# Patient Record
Sex: Female | Born: 2000 | Race: Black or African American | Hispanic: No | Marital: Single | Smoking: Never smoker
Health system: Southern US, Community
[De-identification: ages and names within clinical notes are randomized; demographics above are authoritative.]

## PROBLEM LIST (undated history)

## (undated) DIAGNOSIS — T7840XA Allergy, unspecified, initial encounter: Secondary | ICD-10-CM

## (undated) DIAGNOSIS — L509 Urticaria, unspecified: Secondary | ICD-10-CM

## (undated) DIAGNOSIS — Z975 Presence of (intrauterine) contraceptive device: Secondary | ICD-10-CM

## (undated) HISTORY — DX: Allergy, unspecified, initial encounter: T78.40XA

## (undated) HISTORY — DX: Urticaria, unspecified: L50.9

## (undated) HISTORY — DX: Presence of (intrauterine) contraceptive device: Z97.5

---
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
## 2018-03-30 DIAGNOSIS — Z23 Encounter for immunization: Secondary | ICD-10-CM | POA: Insufficient documentation

## 2018-03-30 DIAGNOSIS — Z0000 Encounter for general adult medical examination without abnormal findings: Secondary | ICD-10-CM | POA: Insufficient documentation

## 2018-12-07 ENCOUNTER — Other Ambulatory Visit: Payer: Self-pay | Admitting: Registered"

## 2018-12-07 DIAGNOSIS — Z20822 Contact with and (suspected) exposure to covid-19: Secondary | ICD-10-CM

## 2018-12-08 LAB — NOVEL CORONAVIRUS, NAA: SARS-CoV-2, NAA: NOT DETECTED

## 2018-12-14 ENCOUNTER — Other Ambulatory Visit: Payer: Self-pay

## 2018-12-14 DIAGNOSIS — Z20822 Contact with and (suspected) exposure to covid-19: Secondary | ICD-10-CM

## 2018-12-17 LAB — NOVEL CORONAVIRUS, NAA: SARS-CoV-2, NAA: NOT DETECTED

## 2018-12-17 LAB — SPECIMEN STATUS REPORT

## 2019-02-17 ENCOUNTER — Other Ambulatory Visit: Payer: Self-pay

## 2019-02-17 ENCOUNTER — Ambulatory Visit: Payer: BC Managed Care – PPO | Attending: Internal Medicine

## 2019-02-17 DIAGNOSIS — Z20822 Contact with and (suspected) exposure to covid-19: Secondary | ICD-10-CM

## 2019-02-19 LAB — NOVEL CORONAVIRUS, NAA: SARS-CoV-2, NAA: NOT DETECTED

## 2019-05-05 ENCOUNTER — Ambulatory Visit: Payer: BC Managed Care – PPO | Attending: Family

## 2019-05-05 DIAGNOSIS — Z23 Encounter for immunization: Secondary | ICD-10-CM

## 2019-05-05 NOTE — Progress Notes (Signed)
   Covid-19 Vaccination Clinic  Name:  Regina Robinson    MRN: [id number] DOB: 2000-09-10  05/05/2019  Regina Robinson was observed post Covid-19 immunization for 15 minutes without incident. She was provided with Vaccine Information Sheet and instruction to access the V-Safe system.   Regina Robinson was instructed to call 911 with any severe reactions post vaccine: Marland Kitchen Difficulty breathing  . Swelling of face and throat  . A fast heartbeat  . A bad rash all over body  . Dizziness and weakness   Immunizations Administered    Name Date Dose VIS Date Route   Moderna COVID-19 Vaccine 05/05/2019  2:49 PM 0.5 mL 01/18/2019 Intramuscular   Manufacturer: Moderna   Lot: [id number]   NDC: 64353-912-25

## 2019-06-07 ENCOUNTER — Ambulatory Visit: Payer: BC Managed Care – PPO | Attending: Family

## 2019-06-07 DIAGNOSIS — Z23 Encounter for immunization: Secondary | ICD-10-CM

## 2019-06-07 NOTE — Progress Notes (Signed)
   Covid-19 Vaccination Clinic  Name:  Regina Robinson    MRN: [id number] DOB: [birth year]  06/07/2019  [name] was observed post Covid-19 immunization for 15 minutes without incident. She was provided with Vaccine Information Sheet and instruction to access the V-Safe system.   [name] was instructed to call 911 with any severe reactions post vaccine: Marland Kitchen Difficulty breathing  . Swelling of face and throat  . A fast heartbeat  . A bad rash all over body  . Dizziness and weakness   Immunizations Administered    Name Date Dose VIS Date Route   Moderna COVID-19 Vaccine 06/07/2019 12:32 PM 0.5 mL 01/2019 Intramuscular   Manufacturer: Moderna   Lot: [id number]   NDC: 02301-720-91

## 2019-08-08 ENCOUNTER — Encounter: Payer: Self-pay | Admitting: Medical

## 2019-08-08 ENCOUNTER — Other Ambulatory Visit: Payer: Self-pay

## 2019-08-08 ENCOUNTER — Encounter: Payer: 59 | Admitting: Medical

## 2019-08-08 ENCOUNTER — Ambulatory Visit (INDEPENDENT_AMBULATORY_CARE_PROVIDER_SITE_OTHER): Payer: 59 | Admitting: Medical

## 2019-08-08 VITALS — BP 110/80 | HR 95 | Ht 65.0 in | Wt 159.4 lb

## 2019-08-08 DIAGNOSIS — Z23 Encounter for immunization: Secondary | ICD-10-CM | POA: Diagnosis not present

## 2019-08-08 DIAGNOSIS — Z0000 Encounter for general adult medical examination without abnormal findings: Secondary | ICD-10-CM | POA: Insufficient documentation

## 2019-08-08 DIAGNOSIS — Z130 Encounter for screening for diseases of the blood and blood-forming organs and certain disorders involving the immune mechanism: Secondary | ICD-10-CM

## 2019-08-08 DIAGNOSIS — Z13220 Encounter for screening for lipoid disorders: Secondary | ICD-10-CM | POA: Insufficient documentation

## 2019-08-08 DIAGNOSIS — Z569 Unspecified problems related to employment: Secondary | ICD-10-CM | POA: Insufficient documentation

## 2019-08-08 DIAGNOSIS — Z111 Encounter for screening for respiratory tuberculosis: Secondary | ICD-10-CM | POA: Insufficient documentation

## 2019-08-08 DIAGNOSIS — Z139 Encounter for screening, unspecified: Secondary | ICD-10-CM

## 2019-08-08 LAB — POCT URINALYSIS DIP (PROADVANTAGE DEVICE)
Bilirubin, UA: NEGATIVE
Blood, UA: NEGATIVE
Glucose, UA: NEGATIVE mg/dL
Ketones, POC UA: NEGATIVE mg/dL
Leukocytes, UA: NEGATIVE
Nitrite, UA: NEGATIVE
Specific Gravity, Urine: 1.03
Urobilinogen, Ur: NEGATIVE
pH, UA: 6 (ref 5.0–8.0)

## 2019-08-08 NOTE — Progress Notes (Signed)
Subjective: Chief Complaint  Patient presents with  . Annual Exam    Medical team: Sees dentist, Sees gynecology Regina Robinson, Kermit Balo, PA-C here for primary care   Concerns Needs form completed for school.  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      She feels healthy, doing well  Recently had Pap smear through gynecology it was normal  She is on contraception  Reviewed their medical, surgical, family, social, medication, and allergy history and updated chart as appropriate.  Past Medical History:  Diagnosis Date  . Nexplanon in place 06/2016   [facility] OB/Gyn, Dr. Janee Morn    No past surgical history on file.  Social History   Socioeconomic History  . Marital status: Single    Spouse name: Not on file  . Number of children: Not on file  . Years of education: Not on file  . Highest education level: Not on file  Occupational History  . Not on file  Tobacco Use  . Smoking status: Never Smoker  . Smokeless tobacco: Never Used  Vaping Use  . Vaping Use: Never used  Substance and Sexual Activity  . Alcohol use: No  . Drug use: No  . Sexual activity: Not on file  Other Topics Concern  . 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   Exercise - plays basketball, goes to the gym.  07/2019   Social Determinants of Health   Financial Resource Strain:   . Difficulty of Paying Living Expenses:   Food Insecurity:   . Worried About Programme researcher, broadcasting/film/video in the Last Year:   . Barista in the Last Year:   Transportation Needs:   . Freight forwarder (Medical):   Marland Kitchen Lack of Transportation (Non-Medical):   Physical Activity:   . Days of Exercise per Week:   . Minutes of Exercise per Session:   Stress:   . Feeling of Stress :   Social Connections:   . Frequency of Communication with Friends and Family:   . Frequency of Social Gatherings with Friends and Family:   . Attends Religious Services:    . Active Member of Clubs or Organizations:   . Attends Banker Meetings:   Marland Kitchen Marital Status:   Intimate Partner Violence:   . Fear of Current or Ex-Partner:   . Emotionally Abused:   Marland Kitchen Physically Abused:   . Sexually Abused:     Family History  Problem Relation Age of Onset  . Sickle cell trait Mother   . Diabetes Maternal Grandfather   . Heart disease Paternal Grandfather        heart transplant  . Cancer Neg Hx     No current outpatient medications on file.  Allergies  Allergen Reactions  . Sulfa Drugs Cross Reactors Hives    Review of Systems Constitutional: -fever, -chills, -sweats, -unexpected weight change, -decreased appetite, -fatigue Allergy: -sneezing, -itching, -congestion Dermatology: -changing moles, --rash, -lumps ENT: -runny nose, -ear pain, -sore throat, -hoarseness, -sinus pain, -teeth pain, - ringing in ears, -hearing loss, -nosebleeds Cardiology: -chest pain, -palpitations, -swelling, -difficulty breathing when lying flat, -waking up short of breath Respiratory: -cough, -shortness of breath, -difficulty breathing with exercise or exertion, -wheezing, -coughing up blood Gastroenterology: -abdominal pain, -nausea, -vomiting, -diarrhea, -constipation, -blood in stool, -changes in bowel movement, -difficulty swallowing or eating Hematology: -bleeding, -bruising  Musculoskeletal: -joint aches, -muscle aches, -joint swelling, -back pain, -neck  pain, -cramping, -changes in gait Ophthalmology: denies vision changes, eye redness, itching, discharge Urology: -burning with urination, -difficulty urinating, -blood in urine, -urinary frequency, -urgency, -incontinence Neurology: -headache, -weakness, -tingling, -numbness, -memory loss, -falls, -dizziness Psychology: -depressed mood, -agitation, -sleep problems Breast/gyn: -breast tendnerss, -discharge, -lumps, -vaginal discharge,- irregular periods, -heavy periods     Objective:  BP 110/80   Pulse 95    Ht 5\' 5"  (1.651 m)   Wt 159 lb 6.4 oz (72.3 kg)   SpO2 98%   BMI 26.53 kg/m   General appearance: alert, no distress, WD/WN, African American female Skin: Normal neck: supple, no lymphadenopathy, no thyromegaly, no masses, normal ROM, no bruits Chest: non tender, normal shape and expansion Heart: RRR, normal S1, S2, no murmurs Lungs: CTA bilaterally, no wheezes, rhonchi, or rales Abdomen: +bs, soft, non tender, non distended, no masses, no hepatomegaly, no splenomegaly, no bruits Back: non tender, normal ROM, no scoliosis Musculoskeletal: upper extremities non tender, no obvious deformity, normal ROM throughout, lower extremities non tender, no obvious deformity, normal ROM throughout Extremities: no edema, no cyanosis, no clubbing Pulses: 2+ symmetric, upper and lower extremities, normal cap refill Neurological: alert, oriented x 3, CN2-12 intact, strength normal upper extremities and lower extremities, sensation normal throughout, DTRs 2+ throughout, no cerebellar signs, gait normal Psychiatric: normal affect, behavior normal, pleasant  Breast/gyn/rectal - deferred to gynecology     Assessment and Plan :   Encounter Diagnoses  Name Primary?  . Encounter for health maintenance examination in adult Yes  . Screening for tuberculosis   . Screening for lipid disorders   . Screening for deficiency anemia   . Need for Tdap vaccination   . Need for meningitis vaccination   . Screening for condition   . Adverse exposure in workplace     Physical exam - discussed and counseled on healthy lifestyle, diet, exercise, preventative care, vaccinations, sick and well care, proper use of emergency dept and after hours care, and addressed their concerns.    Health screening: Advised they see their eye doctor yearly for routine vision care. Advised they see their dentist yearly for routine dental care including hygiene visits twice yearly. See your gynecologist yearly for routine  gynecological care.   Cancer screening Counseled on self breast exams, mammograms, cervical cancer screening   Vaccinations: Advised yearly influenza vaccine  Counseled on the Tdap (tetanus, diptheria, and acellular pertussis) vaccine.  Vaccine information sheet given. Tdap vaccine given after consent obtained.  Counseled on the meningococcal vaccine.  Vaccine information sheet given.  Meningococcal vaccine given after consent obtained.  Lab screening today for tuberculosis and titers for hepatitis B and varicella since she will be working as a [employer] [place] was seen today for annual exam.  Diagnoses and all orders for this visit:  Encounter for health maintenance examination in adult -     Varicella zoster antibody, IgG -     Hepatitis B surface antibody,quantitative -     QuantiFERON-TB Gold Plus -     CBC -     Lipid panel -     Comprehensive metabolic panel -     POCT Urinalysis DIP (Proadvantage Device)  Screening for tuberculosis -     QuantiFERON-TB Gold Plus  Screening for lipid disorders -     Lipid panel  Screening for deficiency anemia -     CBC  Need for Tdap vaccination  Need for meningitis vaccination  Screening for condition -     Varicella zoster antibody,  IgG -     Hepatitis B surface antibody,quantitative -     QuantiFERON-TB Gold Plus  Adverse exposure in workplace  Other orders -     Meningococcal MCV4O(Menveo) -     Tdap vaccine greater than or equal to 7yo IM   Follow-up pending labs, yearly for physical

## 2019-08-10 LAB — CBC
Hematocrit: 38.7 % (ref 34.0–46.6)
Hemoglobin: 12.9 g/dL (ref 11.1–15.9)
MCH: 27.4 pg (ref 26.6–33.0)
MCHC: 33.3 g/dL (ref 31.5–35.7)
MCV: 82 fL (ref 79–97)
Platelets: 314 10*3/uL (ref 150–450)
RBC: 4.7 x10E6/uL (ref 3.77–5.28)
RDW: 13 % (ref 11.7–15.4)
WBC: 4.1 10*3/uL (ref 3.4–10.8)

## 2019-08-10 LAB — COMPREHENSIVE METABOLIC PANEL
ALT: 11 IU/L (ref 0–32)
AST: 15 IU/L (ref 0–40)
Albumin/Globulin Ratio: 1.6 (ref 1.2–2.2)
Albumin: 4.4 g/dL (ref 3.9–5.0)
Alkaline Phosphatase: 60 IU/L (ref 48–121)
BUN/Creatinine Ratio: 9 (ref 9–23)
BUN: 10 mg/dL (ref 6–20)
Bilirubin Total: 0.6 mg/dL (ref 0.0–1.2)
CO2: 23 mmol/L (ref 20–29)
Calcium: 9.4 mg/dL (ref 8.7–10.2)
Chloride: 105 mmol/L (ref 96–106)
Creatinine, Ser: 1.14 mg/dL — ABNORMAL HIGH (ref 0.57–1.00)
GFR calc Af Amer: 79 mL/min/{1.73_m2} (ref >59)
GFR calc non Af Amer: 69 mL/min/{1.73_m2} (ref >59)
Globulin, Total: 2.8 g/dL (ref 1.5–4.5)
Glucose: 90 mg/dL (ref 65–99)
Potassium: 4.7 mmol/L (ref 3.5–5.2)
Sodium: 139 mmol/L (ref 134–144)
Total Protein: 7.2 g/dL (ref 6.0–8.5)

## 2019-08-10 LAB — QUANTIFERON-TB GOLD PLUS
QuantiFERON Mitogen Value: >10 IU/mL
QuantiFERON Nil Value: 0 IU/mL
QuantiFERON TB1 Ag Value: 0.03 IU/mL
QuantiFERON TB2 Ag Value: 0.01 IU/mL
QuantiFERON-TB Gold Plus: NEGATIVE

## 2019-08-10 LAB — LIPID PANEL
Chol/HDL Ratio: 3 ratio (ref 0.0–4.4)
Cholesterol, Total: 173 mg/dL (ref 100–199)
HDL: 57 mg/dL (ref >39)
LDL Chol Calc (NIH): 103 mg/dL — ABNORMAL HIGH (ref 0–99)
Triglycerides: 65 mg/dL (ref 0–149)
VLDL Cholesterol Cal: 13 mg/dL (ref 5–40)

## 2019-08-10 LAB — HEPATITIS B SURFACE ANTIBODY, QUANTITATIVE: Hepatitis B Surf Ab Quant: 99 m[IU]/mL (ref >9.9)

## 2019-08-10 LAB — VARICELLA ZOSTER ANTIBODY, IGG: Varicella zoster IgG: 355 index (ref >165)

## 2020-02-13 ENCOUNTER — Encounter: Payer: Self-pay | Admitting: Family Medicine

## 2020-02-13 ENCOUNTER — Telehealth (INDEPENDENT_AMBULATORY_CARE_PROVIDER_SITE_OTHER): Payer: 59 | Admitting: Family Medicine

## 2020-02-13 VITALS — Ht 65.0 in | Wt 150.0 lb

## 2020-02-13 DIAGNOSIS — U071 COVID-19: Secondary | ICD-10-CM | POA: Diagnosis not present

## 2020-02-13 NOTE — Progress Notes (Signed)
   Subjective:    Patient ID: Regina Robinson, female    DOB: 2000/10/03, 19 y.o.   MRN: [id number]  HPI I connected with  Regina Robinson on 02/13/20 by a video enabled telemedicine application and verified that I am speaking with the correct person using two identifiers.  Curability used.  Me: Office patient: Home I discussed the limitations of evaluation and management by telemedicine. The patient expressed understanding and agreed to proceed. She started having a sore throat on the 21st followed by headache, dry cough that has become slightly productive.  She has been using OTC medications.  She has had no fever, chills, shortness of breath, smell or taste changes.  She did have 2 [place] vaccines.  She was tested last night and it was positive.   Review of Systems     Objective:   Physical Exam Alert and in no distress.  Normal breathing pattern noted.       Assessment & Plan:  COVID-19 I discussed the diagnosis and treatment which at this point is supportive care.  Discussed worsening of cough, shortness of breath, fever.  Instructed her to inform me by that she has been there to potentially get tested or at least sequester.  Recommend that they discuss this further with her PCP. 20 minutes spent today reviewing medical record, history, documentation and consultation.

## 2020-04-06 ENCOUNTER — Ambulatory Visit: Payer: BC Managed Care – PPO | Attending: Family

## 2020-04-06 DIAGNOSIS — Z23 Encounter for immunization: Secondary | ICD-10-CM

## 2020-07-19 LAB — HM PAP SMEAR

## 2020-07-23 ENCOUNTER — Encounter: Payer: Self-pay | Admitting: Medical

## 2020-07-27 NOTE — Progress Notes (Signed)
   Covid-19 Vaccination Clinic  Name:  Regina Robinson    MRN: [id number] DOB: [birth year]  07/27/2020  [name] was observed post Covid-19 immunization for 15 minutes without incident. She was provided with Vaccine Information Sheet and instruction to access the V-Safe system.   [name] was instructed to call 911 with any severe reactions post vaccine: Difficulty breathing  Swelling of face and throat  A fast heartbeat  A bad rash all over body  Dizziness and weakness   Immunizations Administered     Name Date Dose VIS Date Route   Moderna Covid-19 Booster Vaccine 04/06/2020  1:00 PM 0.25 mL 12/07/2019 Intramuscular   Manufacturer: Moderna   Lot: [id number]   NDC: 18867-737-36

## 2020-08-08 ENCOUNTER — Other Ambulatory Visit: Payer: Self-pay

## 2020-08-08 ENCOUNTER — Encounter: Payer: Self-pay | Admitting: Medical

## 2020-08-08 ENCOUNTER — Ambulatory Visit (INDEPENDENT_AMBULATORY_CARE_PROVIDER_SITE_OTHER): Payer: BC Managed Care – PPO | Admitting: Medical

## 2020-08-08 VITALS — BP 110/78 | HR 84 | Ht 65.0 in | Wt 173.4 lb

## 2020-08-08 DIAGNOSIS — Z13220 Encounter for screening for lipoid disorders: Secondary | ICD-10-CM

## 2020-08-08 DIAGNOSIS — Z0000 Encounter for general adult medical examination without abnormal findings: Secondary | ICD-10-CM | POA: Diagnosis not present

## 2020-08-08 LAB — POCT URINALYSIS DIP (PROADVANTAGE DEVICE)
Glucose, UA: NEGATIVE mg/dL
Nitrite, UA: NEGATIVE
Protein Ur, POC: 30 mg/dL — AB
Specific Gravity, Urine: 1.03
pH, UA: 6 (ref 5.0–8.0)

## 2020-08-08 NOTE — Patient Instructions (Signed)
Health Maintenance, Female Adopting a healthy lifestyle and getting preventive care are important in promoting health and wellness. Ask your health care provider about: The right schedule for you to have regular tests and exams. Things you can do on your own to prevent diseases and keep yourself healthy. What should I know about diet, weight, and exercise? Eat a healthy diet  Eat a diet that includes plenty of vegetables, fruits, low-fat dairy products, and lean protein. Do not eat a lot of foods that are high in solid fats, added sugars, or sodium.  Maintain a healthy weight Body mass index (BMI) is used to identify weight problems. It estimates body fat based on height and weight. Your health care provider can help determineyour BMI and help you achieve or maintain a healthy weight. Get regular exercise Get regular exercise. This is one of the most important things you can do for your health. Most adults should: Exercise for at least 150 minutes each week. The exercise should increase your heart rate and make you sweat (moderate-intensity exercise). Do strengthening exercises at least twice a week. This is in addition to the moderate-intensity exercise. Spend less time sitting. Even light physical activity can be beneficial. Watch cholesterol and blood lipids Have your blood tested for lipids and cholesterol at 22 years of age, then havethis test every 5 years. Have your cholesterol levels checked more often if: Your lipid or cholesterol levels are high. You are older than 22 years of age. You are at high risk for heart disease. What should I know about cancer screening? Depending on your health history and family history, you may need to have cancer screening at various ages. This may include screening for: Breast cancer. Cervical cancer. Colorectal cancer. Skin cancer. Lung cancer. What should I know about heart disease, diabetes, and high blood pressure? Blood pressure and heart  disease High blood pressure causes heart disease and increases the risk of stroke. This is more likely to develop in people who have high blood pressure readings, are of African descent, or are overweight. Have your blood pressure checked: Every 3-5 years if you are 18-39 years of age. Every year if you are 40 years old or older. Diabetes Have regular diabetes screenings. This checks your fasting blood sugar level. Have the screening done: Once every three years after age 40 if you are at a normal weight and have a low risk for diabetes. More often and at a younger age if you are overweight or have a high risk for diabetes. What should I know about preventing infection? Hepatitis B If you have a higher risk for hepatitis B, you should be screened for this virus. Talk with your health care provider to find out if you are at risk forhepatitis B infection. Hepatitis C Testing is recommended for: Everyone born from 1945 through 1965. Anyone with known risk factors for hepatitis C. Sexually transmitted infections (STIs) Get screened for STIs, including gonorrhea and chlamydia, if: You are sexually active and are younger than 22 years of age. You are older than 22 years of age and your health care provider tells you that you are at risk for this type of infection. Your sexual activity has changed since you were last screened, and you are at increased risk for chlamydia or gonorrhea. Ask your health care provider if you are at risk. Ask your health care provider about whether you are at high risk for HIV. Your health care provider may recommend a prescription medicine to help   prevent HIV infection. If you choose to take medicine to prevent HIV, you should first get tested for HIV. You should then be tested every 3 months for as long as you are taking the medicine. Pregnancy If you are about to stop having your period (premenopausal) and you may become pregnant, seek counseling before you get  pregnant. Take 400 to 800 micrograms (mcg) of folic acid every day if you become pregnant. Ask for birth control (contraception) if you want to prevent pregnancy. Osteoporosis and menopause Osteoporosis is a disease in which the bones lose minerals and strength with aging. This can result in bone fractures. If you are 65 years old or older, or if you are at risk for osteoporosis and fractures, ask your health care provider if you should: Be screened for bone loss. Take a calcium or vitamin D supplement to lower your risk of fractures. Be given hormone replacement therapy (HRT) to treat symptoms of menopause. Follow these instructions at home: Lifestyle Do not use any products that contain nicotine or tobacco, such as cigarettes, e-cigarettes, and chewing tobacco. If you need help quitting, ask your health care provider. Do not use street drugs. Do not share needles. Ask your health care provider for help if you need support or information about quitting drugs. Alcohol use Do not drink alcohol if: Your health care provider tells you not to drink. You are pregnant, may be pregnant, or are planning to become pregnant. If you drink alcohol: Limit how much you use to 0-1 drink a day. Limit intake if you are breastfeeding. Be aware of how much alcohol is in your drink. In the [place] one drink equals one 12 oz bottle of beer (355 mL), one 5 oz glass of wine (148 mL), or one 1 oz glass of hard liquor (44 mL). General instructions Schedule regular health, dental, and eye exams. Stay current with your vaccines. Tell your health care provider if: You often feel depressed. You have ever been abused or do not feel safe at home. Summary Adopting a healthy lifestyle and getting preventive care are important in promoting health and wellness. Follow your health care provider's instructions about healthy diet, exercising, and getting tested or screened for diseases. Follow your health care provider's  instructions on monitoring your cholesterol and blood pressure. This information is not intended to replace advice given to you by your health care provider. Make sure you discuss any questions you have with your healthcare provider. Document Revised: 01/27/2018 Document Reviewed: 01/27/2018 Elsevier Patient Education  2022 Elsevier Inc.  

## 2020-08-08 NOTE — Progress Notes (Signed)
Subjective: Chief Complaint  Patient presents with   Annual Exam    Physical fasting labs     Medical team: Sees dentist, gynecology, Dr. Allyn Kenner Charnell Peplinski, Camelia Eng, PA-C here for primary care   Concerns Doing well  She is on contraception  Reviewed their medical, surgical, family, social, medication, and allergy history and updated chart as appropriate.  History reviewed. No pertinent past medical history.   History reviewed. No pertinent surgical history.  Social History   Socioeconomic History   Marital status: Single    Spouse name: Not on file   Number of children: Not on file   Years of education: Not on file   Highest education level: Not on file  Occupational History   Not on file  Tobacco Use   Smoking status: Never   Smokeless tobacco: Never  Vaping Use   Vaping Use: Never used  Substance and Sexual Activity   Alcohol use: No   Drug use: No   Sexual activity: Not on file  Other Topics Concern   Not on file  Social History Narrative   Martin Majestic to [place].   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  Exercise - plays basketball, goes to the gym.  07/2020   Social Determinants of Health   Financial Resource Strain: Not on file  Food Insecurity: Not on file  Transportation Needs: Not on file  Physical Activity: Not on file  Stress: Not on file  Social Connections: Not on file  Intimate Partner Violence: Not on file    Family History  Problem Relation Age of Onset   Sickle cell trait Mother    Diabetes Maternal Grandfather    Heart disease Paternal Grandfather        heart transplant   Cancer Neg Hx      Current Outpatient Medications:    LARIN FE 1/20 1-20 MG-MCG tablet, Take 1 tablet by mouth daily., Disp: , Rfl:   Allergies  Allergen Reactions   Sulfa Drugs Cross Reactors Hives    Review of Systems Constitutional: -fever, -chills, -sweats,  -unexpected weight change, -decreased appetite, -fatigue Allergy: -sneezing, -itching, -congestion Dermatology: -changing moles, --rash, -lumps ENT: -runny nose, -ear pain, -sore throat, -hoarseness, -sinus pain, -teeth pain, - ringing in ears, -hearing loss, -nosebleeds Cardiology: -chest pain, -palpitations, -swelling, -difficulty breathing when lying flat, -waking up short of breath Respiratory: -cough, -shortness of breath, -difficulty breathing with exercise or exertion, -wheezing, -coughing up blood Gastroenterology: -abdominal pain, -nausea, -vomiting, -diarrhea, -constipation, -blood in stool, -changes in bowel movement, -difficulty swallowing or eating Hematology: -bleeding, -bruising  Musculoskeletal: -joint aches, -muscle aches, -joint swelling, -back pain, -neck pain, -cramping, -changes in gait Ophthalmology: denies vision changes, eye redness, itching, discharge Urology: -burning with urination, -difficulty urinating, -blood in urine, -urinary frequency, -urgency, -incontinence Neurology: -headache, -weakness, -tingling, -numbness, -memory loss, -falls, -dizziness Psychology: -depressed mood, -agitation, -sleep problems Breast/gyn: -breast tendnerss, -discharge, -lumps, -vaginal discharge,- irregular periods, -heavy periods     Objective:  BP 110/78   Pulse 84   Ht $R'5\' 5"'ou$  (1.651 m)   Wt 173 lb 6.4 oz (78.7 kg)   BMI 28.86 kg/m   Wt Readings from Last 3 Encounters:  08/08/20 173 lb 6.4 oz (78.7 kg)  02/13/20 150 lb (68 kg)  08/08/19 159 lb 6.4 oz (72.3 kg)    General appearance: alert, no distress, WD/WN, African American female Skin: Normal neck: supple, no lymphadenopathy, no  thyromegaly, no masses, normal ROM, no bruits Chest: non tender, normal shape and expansion Heart: RRR, normal S1, S2, no murmurs Lungs: CTA bilaterally, no wheezes, rhonchi, or rales Abdomen: +bs, soft, non tender, non distended, no masses, no hepatomegaly, no splenomegaly, no bruits Back: non  tender, normal ROM, no scoliosis Musculoskeletal: upper extremities non tender, no obvious deformity, normal ROM throughout, lower extremities non tender, no obvious deformity, normal ROM throughout Extremities: no edema, no cyanosis, no clubbing Pulses: 2+ symmetric, upper and lower extremities, normal cap refill Neurological: alert, oriented x 3, CN2-12 intact, strength normal upper extremities and lower extremities, sensation normal throughout, DTRs 2+ throughout, no cerebellar signs, gait normal Psychiatric: normal affect, behavior normal, pleasant  Breast/gyn/rectal - deferred to gynecology     Assessment and Plan :   Encounter Diagnoses  Name Primary?   Encounter for health maintenance examination in adult Yes   Screening for lipid disorders      Physical exam - discussed and counseled on healthy lifestyle, diet, exercise, preventative care, vaccinations, sick and well care, proper use of emergency dept and after hours care, and addressed their concerns.    Health screening: Advised they see their eye doctor yearly for routine vision care. Advised they see their dentist yearly for routine dental care including hygiene visits twice yearly. See your gynecologist yearly for routine gynecological care.   Cancer screening Counseled on self breast exams, mammograms, cervical cancer screening   Vaccinations: Immunization History  Administered Date(s) Administered   DTaP 05/17/1998, 07/17/1998, 09/20/1998, 08/05/1999, 03/27/2003   Hepatitis B 05/17/1998, 07/17/1998, 02/07/1999   HiB (PRP-OMP) 05/17/1998, 07/17/1998, 09/20/1998, 08/05/1999   Hpv-Unspecified 05/22/2016, 07/21/2016, 11/24/2016   IPV 05/17/1998, 07/17/1998, 02/07/1999, 03/27/2003   Influenza,inj,Quad PF,6+ Mos 05/12/2016   MMR 05/03/1999, 03/27/2003   Meningococcal B, OMV 09/18/2016, 09/25/2017   Meningococcal Mcv4o 09/18/2016, 08/08/2019   Moderna SARS-COV2 Booster Vaccination 04/06/2020   Moderna Sars-Covid-2  Vaccination 05/05/2019, 06/07/2019   Pneumococcal Conjugate-13 07/17/1998, 09/20/1998, 02/07/1999, 05/03/1999   Tdap 09/19/2009, 08/08/2019   Varicella 05/03/1999, 09/18/2016    Advised yearly influenza vaccine  Reviewed vaccine records   Arriana was seen today for annual exam.  Diagnoses and all orders for this visit:  Encounter for health maintenance examination in adult -     POCT Urinalysis DIP (Proadvantage Device) -     Lipid panel -     CBC -     Basic metabolic panel  Screening for lipid disorders -     Lipid panel   Follow-up pending labs, yearly for physical

## 2020-08-09 LAB — CBC
Hematocrit: 37 % (ref 34.0–46.6)
Hemoglobin: 12.2 g/dL (ref 11.1–15.9)
MCH: 27.8 pg (ref 26.6–33.0)
MCHC: 33 g/dL (ref 31.5–35.7)
MCV: 84 fL (ref 79–97)
Platelets: 304 10*3/uL (ref 150–450)
RBC: 4.39 x10E6/uL (ref 3.77–5.28)
RDW: 14.1 % (ref 11.7–15.4)
WBC: 6.4 10*3/uL (ref 3.4–10.8)

## 2020-08-09 LAB — BASIC METABOLIC PANEL
BUN/Creatinine Ratio: 8 — ABNORMAL LOW (ref 9–23)
BUN: 8 mg/dL (ref 6–20)
CO2: 22 mmol/L (ref 20–29)
Calcium: 9.1 mg/dL (ref 8.7–10.2)
Chloride: 102 mmol/L (ref 96–106)
Creatinine, Ser: 0.97 mg/dL (ref 0.57–1.00)
Glucose: 74 mg/dL (ref 65–99)
Potassium: 4.5 mmol/L (ref 3.5–5.2)
Sodium: 142 mmol/L (ref 134–144)
eGFR: 85 mL/min/{1.73_m2} (ref >59)

## 2020-08-09 LAB — LIPID PANEL
Chol/HDL Ratio: 2.8 ratio (ref 0.0–4.4)
Cholesterol, Total: 156 mg/dL (ref 100–199)
HDL: 55 mg/dL (ref >39)
LDL Chol Calc (NIH): 86 mg/dL (ref 0–99)
Triglycerides: 76 mg/dL (ref 0–149)
VLDL Cholesterol Cal: 15 mg/dL (ref 5–40)

## 2020-08-23 ENCOUNTER — Other Ambulatory Visit (INDEPENDENT_AMBULATORY_CARE_PROVIDER_SITE_OTHER): Payer: BC Managed Care – PPO

## 2020-08-23 ENCOUNTER — Other Ambulatory Visit: Payer: Self-pay

## 2020-08-23 ENCOUNTER — Other Ambulatory Visit: Payer: Self-pay | Admitting: Medical

## 2020-08-23 ENCOUNTER — Telehealth: Payer: Self-pay | Admitting: Internal Medicine

## 2020-08-23 DIAGNOSIS — R3129 Other microscopic hematuria: Secondary | ICD-10-CM | POA: Diagnosis not present

## 2020-08-23 LAB — POCT URINALYSIS DIP (PROADVANTAGE DEVICE)
Bilirubin, UA: NEGATIVE
Blood, UA: NEGATIVE
Glucose, UA: NEGATIVE mg/dL
Ketones, POC UA: NEGATIVE mg/dL
Leukocytes, UA: NEGATIVE
Nitrite, UA: NEGATIVE
Protein Ur, POC: 30 mg/dL — AB
Specific Gravity, Urine: 1.025
Urobilinogen, Ur: NEGATIVE
pH, UA: 6 (ref 5.0–8.0)

## 2020-08-23 LAB — POCT UA - MICROSCOPIC ONLY
Bacteria, U Microscopic: POSITIVE
Casts, Ur, LPF, POC: NEGATIVE
Crystals, Ur, HPF, POC: NEGATIVE
Mucus, UA: POSITIVE
Yeast, UA: NEGATIVE

## 2020-08-23 NOTE — Telephone Encounter (Signed)
Done

## 2020-08-23 NOTE — Telephone Encounter (Signed)
Please put in order for urine that pt is coming back in this afternoon for

## 2020-08-24 ENCOUNTER — Other Ambulatory Visit: Payer: Self-pay | Admitting: Medical

## 2020-08-24 DIAGNOSIS — R809 Proteinuria, unspecified: Secondary | ICD-10-CM

## 2021-07-18 HISTORY — PX: NO PAST SURGERIES: SHX2092

## 2021-07-22 LAB — OB RESULTS CONSOLE GC/CHLAMYDIA: Chlamydia: NEGATIVE

## 2021-07-23 ENCOUNTER — Encounter: Payer: Self-pay | Admitting: *Deleted

## 2021-08-09 ENCOUNTER — Encounter: Payer: BC Managed Care – PPO | Admitting: Medical

## 2021-08-16 ENCOUNTER — Ambulatory Visit: Payer: BC Managed Care – PPO | Admitting: Medical

## 2021-08-16 ENCOUNTER — Encounter: Payer: Self-pay | Admitting: Medical

## 2021-08-16 VITALS — BP 120/70 | HR 55 | Ht 65.75 in | Wt 190.6 lb

## 2021-08-16 DIAGNOSIS — Z0000 Encounter for general adult medical examination without abnormal findings: Secondary | ICD-10-CM | POA: Diagnosis not present

## 2021-08-16 DIAGNOSIS — Z13220 Encounter for screening for lipoid disorders: Secondary | ICD-10-CM | POA: Diagnosis not present

## 2021-08-16 DIAGNOSIS — Z1329 Encounter for screening for other suspected endocrine disorder: Secondary | ICD-10-CM | POA: Diagnosis not present

## 2021-08-16 NOTE — Progress Notes (Signed)
Subjective: Chief Complaint  Patient presents with   fasting cpe    Fasting cpe, obgyn- [facility]   Medical team: Sees dentist, gynecology, Dr. 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  She is on contraception  Reviewed their medical, surgical, family, social, medication, and allergy history and updated chart as appropriate.  History reviewed. No pertinent past medical history.   Past Surgical History:  Procedure Laterality Date   NO PAST SURGERIES  07/2021    Social History   Socioeconomic History   Marital status: Single    Spouse name: Not on file   Number of children: Not on file   Years of education: Not on file   Highest education level: Not on file  Occupational History   Not on file  Tobacco Use   Smoking status: Never   Smokeless tobacco: Never  Vaping Use   Vaping Use: Never used  Substance and Sexual Activity   Alcohol use: Yes    Comment: occasional   Drug use: No   Sexual activity: Not on file  Other Topics Concern   Not on file  Social History Narrative   Martin Majestic to [place].   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  Exercise - plays basketball, goes to the gym.  07/2020   Social Determinants of Health   Financial Resource Strain: Not on file  Food Insecurity: Not on file  Transportation Needs: Not on file  Physical Activity: Not on file  Stress: Not on file  Social Connections: Not on file  Intimate Partner Violence: Not on file    Family History  Problem Relation Age of Onset   Sickle cell trait Mother    Diabetes Maternal Grandfather    Heart disease Paternal Grandfather        heart transplant   Cancer Neg Hx      Current Outpatient Medications:    LARIN FE 1/20 1-20 MG-MCG tablet, Take 1 tablet by mouth  daily., Disp: , Rfl:   Allergies  Allergen Reactions   Sulfa Drugs Cross Reactors Hives    Review of Systems Constitutional: -fever, -chills, -sweats, -unexpected weight change, -decreased appetite, -fatigue Allergy: -sneezing, -itching, -congestion Dermatology: -changing moles, --rash, -lumps ENT: -runny nose, -ear pain, -sore throat, -hoarseness, -sinus pain, -teeth pain, - ringing in ears, -hearing loss, -nosebleeds Cardiology: -chest pain, -palpitations, -swelling, -difficulty breathing when lying flat, -waking up short of breath Respiratory: -cough, -shortness of breath, -difficulty breathing with exercise or exertion, -wheezing, -coughing up blood Gastroenterology: -abdominal pain, -nausea, -vomiting, -diarrhea, -constipation, -blood in stool, -changes in bowel movement, -difficulty swallowing or eating Hematology: -bleeding, -bruising  Musculoskeletal: -joint aches, -muscle aches, -joint swelling, -back pain, -neck pain, -cramping, -changes in gait Ophthalmology: denies vision changes, eye redness, itching, discharge Urology: -burning with urination, -difficulty urinating, -blood in urine, -urinary frequency, -urgency, -incontinence Neurology: -headache, -weakness, -tingling, -numbness, -memory loss, -falls, -dizziness Psychology: -depressed mood, -agitation, -sleep problems Breast/gyn: -breast tendnerss, -discharge, -lumps, -vaginal discharge,- irregular periods, -heavy periods     Objective:  BP 120/70   Pulse (!) 55   Ht 5' 5.75" (1.67 m)   Wt 190 lb 9.6 oz (86.5 kg)   LMP 08/09/2021   BMI 31.00 kg/m   Wt Readings from Last 3 Encounters:  08/16/21 190 lb  9.6 oz (86.5 kg)  08/08/20 173 lb 6.4 oz (78.7 kg)  02/13/20 150 lb (68 kg)    General appearance: alert, no distress, WD/WN, African American female Skin: Normal neck: supple, no lymphadenopathy, no thyromegaly, no masses, normal ROM, no bruits Chest: non tender, normal shape and expansion Heart: RRR, normal S1,  S2, no murmurs Lungs: CTA bilaterally, no wheezes, rhonchi, or rales Abdomen: +bs, soft, non tender, non distended, no masses, no hepatomegaly, no splenomegaly, no bruits Back: non tender, normal ROM, no scoliosis Musculoskeletal: upper extremities non tender, no obvious deformity, normal ROM throughout, lower extremities non tender, no obvious deformity, normal ROM throughout Extremities: no edema, no cyanosis, no clubbing Pulses: 2+ symmetric, upper and lower extremities, normal cap refill Neurological: alert, oriented x 3, CN2-12 intact, strength normal upper extremities and lower extremities, sensation normal throughout, DTRs 2+ throughout, no cerebellar signs, gait normal Psychiatric: normal affect, behavior normal, pleasant  Breast/gyn/rectal - deferred to gynecology     Assessment and Plan :   Encounter Diagnoses  Name Primary?   Encounter for health maintenance examination in adult Yes   Screening for lipid disorders    Screening for thyroid disorder    This visit was a preventative care visit, also known as wellness visit or routine physical.   Topics typically include healthy lifestyle, diet, exercise, preventative care, vaccinations, sick and well care, proper use of emergency dept and after hours care, as well as other concerns.     Recommendations: Continue to return yearly for your annual wellness and preventative care visits.  This gives [place] a chance to discuss healthy lifestyle, exercise, vaccinations, review your chart record, and perform screenings where appropriate.  I recommend you see your eye doctor yearly for routine vision care.  I recommend you see your dentist yearly for routine dental care including hygiene visits twice yearly.   Vaccination recommendations were reviewed Immunization History  Administered Date(s) Administered   DTaP 05/17/1998, 07/17/1998, 09/20/1998, 08/05/1999, 03/27/2003   Hepatitis B 05/17/1998, 07/17/1998, 02/07/1999   HiB (PRP-OMP)  05/17/1998, 07/17/1998, 09/20/1998, 08/05/1999   Hpv-Unspecified 05/22/2016, 07/21/2016, 11/24/2016   IPV 05/17/1998, 07/17/1998, 02/07/1999, 03/27/2003   Influenza,inj,Quad PF,6+ Mos 05/12/2016, 11/03/2020   MMR 05/03/1999, 03/27/2003   Meningococcal B, OMV 09/18/2016, 09/25/2017   Meningococcal Mcv4o 09/18/2016, 08/08/2019   Moderna SARS-COV2 Booster Vaccination 04/06/2020   Moderna Sars-Covid-2 Vaccination 05/05/2019, 06/07/2019   Pneumococcal Conjugate-13 07/17/1998, 09/20/1998, 02/07/1999, 05/03/1999   Tdap 09/19/2009, 08/08/2019   Varicella 05/03/1999, 09/18/2016    Screening for cancer: Colon cancer screening: Age 40  Breast cancer screening: You should perform a self breast exam monthly   Baseline mammogram at age 19  Cervical cancer screening: We reviewed recommendations for pap smear screening.   Skin cancer screening: Check your skin regularly for new changes, growing lesions, or other lesions of concern Come in for evaluation if you have skin lesions of concern.  Lung cancer screening: If you have a greater than 20 pack year history of tobacco use, then you may qualify for lung cancer screening with a chest CT scan.   Please call your insurance company to inquire about coverage for this test.  We currently don't have screenings for other cancers besides breast, cervical, colon, and lung cancers.  If you have a strong family history of cancer or have other cancer screening concerns, please let me know.    Bone health: Get at least 150 minutes of aerobic exercise weekly Get weight bearing exercise at least once weekly Bone density test:  A bone density test is an imaging test that uses a type of X-ray to measure the amount of calcium and other minerals in your bones. The test may be used to diagnose or screen you for a condition that causes weak or thin bones (osteoporosis), predict your risk for a broken bone (fracture), or determine how well your osteoporosis  treatment is working. The bone density test is recommended for females 54 and older, or females or males <82 if certain risk factors such as thyroid disease, long term use of steroids such as for asthma or rheumatological issues, vitamin D deficiency, estrogen deficiency, family history of osteoporosis, self or family history of fragility fracture in first degree relative.   Heart health: Get at least 150 minutes of aerobic exercise weekly Limit alcohol It is important to maintain a healthy blood pressure and healthy cholesterol numbers  Heart disease screening: Screening for heart disease includes screening for blood pressure, fasting lipids, glucose/diabetes screening, BMI height to weight ratio, reviewed of smoking status, physical activity, and diet.    Goals include blood pressure 120/80 or less, maintaining a healthy lipid/cholesterol profile, preventing diabetes or keeping diabetes numbers under good control, not smoking or using tobacco products, exercising most days per week or at least 150 minutes per week of exercise, and eating healthy variety of fruits and vegetables, healthy oils, and avoiding unhealthy food choices like fried food, fast food, high sugar and high cholesterol foods.     Medical care options: I recommend you continue to seek care here first for routine care.  We try really hard to have available appointments Monday through Friday daytime hours for sick visits, acute visits, and physicals.  Urgent care should be used for after hours and weekends for significant issues that cannot wait till the next day.  The emergency department should be used for significant potentially life-threatening emergencies.  The emergency department is expensive, can often have long wait times for less significant concerns, so try to utilize primary care, urgent care, or telemedicine when possible to avoid unnecessary trips to the emergency department.  Virtual visits and telemedicine have been  introduced since the pandemic started in 2020, and can be convenient ways to receive medical care.  We offer virtual appointments as well to assist you in a variety of options to seek medical care.   Other issues: Work on healthy lifestyle and getting weight back down.  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  Diagnoses and all orders for this visit:  Encounter for health maintenance examination in adult -     Comprehensive metabolic panel -     CBC -     TSH -     Lipid panel  Screening for lipid disorders -     Lipid panel  Screening for thyroid disorder -     TSH    Follow-up pending labs, yearly for physical

## 2021-08-16 NOTE — Patient Instructions (Addendum)
Congratulations on your new job and for graduating!!!  Health Maintenance, Female Adopting a healthy lifestyle and getting preventive care are important in promoting health and wellness. Ask your health care provider about: The right schedule for you to have regular tests and exams. Things you can do on your own to prevent diseases and keep yourself healthy. What should I know about diet, weight, and exercise? Eat a healthy diet  Eat a diet that includes plenty of vegetables, fruits, low-fat dairy products, and lean protein. Do not eat a lot of foods that are high in solid fats, added sugars, or sodium. Maintain a healthy weight Body mass index (BMI) is used to identify weight problems. It estimates body fat based on height and weight. Your health care provider can help determine your BMI and help you achieve or maintain a healthy weight. Get regular exercise Get regular exercise. This is one of the most important things you can do for your health. Most adults should: Exercise for at least 150 minutes each week. The exercise should increase your heart rate and make you sweat (moderate-intensity exercise). Do strengthening exercises at least twice a week. This is in addition to the moderate-intensity exercise. Spend less time sitting. Even light physical activity can be beneficial. Watch cholesterol and blood lipids Have your blood tested for lipids and cholesterol at 23 years of age, then have this test every 5 years. Have your cholesterol levels checked more often if: Your lipid or cholesterol levels are high. You are older than 23 years of age. You are at high risk for heart disease. What should I know about cancer screening? Depending on your health history and family history, you may need to have cancer screening at various ages. This may include screening for: Breast cancer. Cervical cancer. Colorectal cancer. Skin cancer. Lung cancer. What should I know about heart disease,  diabetes, and high blood pressure? Blood pressure and heart disease High blood pressure causes heart disease and increases the risk of stroke. This is more likely to develop in people who have high blood pressure readings or are overweight. Have your blood pressure checked: Every 3-5 years if you are 50-65 years of age. Every year if you are 79 years old or older. Diabetes Have regular diabetes screenings. This checks your fasting blood sugar level. Have the screening done: Once every three years after age 55 if you are at a normal weight and have a low risk for diabetes. More often and at a younger age if you are overweight or have a high risk for diabetes. What should I know about preventing infection? Hepatitis B If you have a higher risk for hepatitis B, you should be screened for this virus. Talk with your health care provider to find out if you are at risk for hepatitis B infection. Hepatitis C Testing is recommended for: Everyone born from [birth year] through 1967. Anyone with known risk factors for hepatitis C. Sexually transmitted infections (STIs) Get screened for STIs, including gonorrhea and chlamydia, if: You are sexually active and are younger than 23 years of age. You are older than 23 years of age and your health care provider tells you that you are at risk for this type of infection. Your sexual activity has changed since you were last screened, and you are at increased risk for chlamydia or gonorrhea. Ask your health care provider if you are at risk. Ask your health care provider about whether you are at high risk for HIV. Your health care provider  may recommend a prescription medicine to help prevent HIV infection. If you choose to take medicine to prevent HIV, you should first get tested for HIV. You should then be tested every 3 months for as long as you are taking the medicine. Pregnancy If you are about to stop having your period (premenopausal) and you may become pregnant,  seek counseling before you get pregnant. Take 400 to 800 micrograms (mcg) of folic acid every day if you become pregnant. Ask for birth control (contraception) if you want to prevent pregnancy. Osteoporosis and menopause Osteoporosis is a disease in which the bones lose minerals and strength with aging. This can result in bone fractures. If you are 47 years old or older, or if you are at risk for osteoporosis and fractures, ask your health care provider if you should: Be screened for bone loss. Take a calcium or vitamin D supplement to lower your risk of fractures. Be given hormone replacement therapy (HRT) to treat symptoms of menopause. Follow these instructions at home: Alcohol use Do not drink alcohol if: Your health care provider tells you not to drink. You are pregnant, may be pregnant, or are planning to become pregnant. If you drink alcohol: Limit how much you have to: 0-1 drink a day. Know how much alcohol is in your drink. In the [place] one drink equals one 12 oz bottle of beer (355 mL), one 5 oz glass of wine (148 mL), or one 1 oz glass of hard liquor (44 mL). Lifestyle Do not use any products that contain nicotine or tobacco. These products include cigarettes, chewing tobacco, and vaping devices, such as e-cigarettes. If you need help quitting, ask your health care provider. Do not use street drugs. Do not share needles. Ask your health care provider for help if you need support or information about quitting drugs. General instructions Schedule regular health, dental, and eye exams. Stay current with your vaccines. Tell your health care provider if: You often feel depressed. You have ever been abused or do not feel safe at home. Summary Adopting a healthy lifestyle and getting preventive care are important in promoting health and wellness. Follow your health care provider's instructions about healthy diet, exercising, and getting tested or screened for diseases. Follow your  health care provider's instructions on monitoring your cholesterol and blood pressure. This information is not intended to replace advice given to you by your health care provider. Make sure you discuss any questions you have with your health care provider. Document Revised: 06/25/2020 Document Reviewed: 06/25/2020 Elsevier Patient Education  2023 [employer].       Check out Salley Slaughter podcast on financial information and financial foundation

## 2021-08-17 LAB — CBC
Hematocrit: 39.4 % (ref 34.0–46.6)
Hemoglobin: 13 g/dL (ref 11.1–15.9)
MCH: 26.9 pg (ref 26.6–33.0)
MCHC: 33 g/dL (ref 31.5–35.7)
MCV: 81 fL (ref 79–97)
Platelets: 356 10*3/uL (ref 150–450)
RBC: 4.84 x10E6/uL (ref 3.77–5.28)
RDW: 13.8 % (ref 11.7–15.4)
WBC: 7.5 10*3/uL (ref 3.4–10.8)

## 2021-08-17 LAB — COMPREHENSIVE METABOLIC PANEL
ALT: 14 IU/L (ref 0–32)
AST: 18 IU/L (ref 0–40)
Albumin/Globulin Ratio: 1.6 (ref 1.2–2.2)
Albumin: 4.4 g/dL (ref 3.9–5.0)
Alkaline Phosphatase: 79 IU/L (ref 44–121)
BUN/Creatinine Ratio: 11 (ref 9–23)
BUN: 11 mg/dL (ref 6–20)
Bilirubin Total: 0.4 mg/dL (ref 0.0–1.2)
CO2: 21 mmol/L (ref 20–29)
Calcium: 9.5 mg/dL (ref 8.7–10.2)
Chloride: 101 mmol/L (ref 96–106)
Creatinine, Ser: 1.01 mg/dL — ABNORMAL HIGH (ref 0.57–1.00)
Globulin, Total: 2.7 g/dL (ref 1.5–4.5)
Glucose: 88 mg/dL (ref 70–99)
Potassium: 4.1 mmol/L (ref 3.5–5.2)
Sodium: 137 mmol/L (ref 134–144)
Total Protein: 7.1 g/dL (ref 6.0–8.5)
eGFR: 80 mL/min/{1.73_m2} (ref >59)

## 2021-08-17 LAB — LIPID PANEL
Chol/HDL Ratio: 3.5 ratio (ref 0.0–4.4)
Cholesterol, Total: 184 mg/dL (ref 100–199)
HDL: 52 mg/dL (ref >39)
LDL Chol Calc (NIH): 114 mg/dL — ABNORMAL HIGH (ref 0–99)
Triglycerides: 100 mg/dL (ref 0–149)
VLDL Cholesterol Cal: 18 mg/dL (ref 5–40)

## 2021-08-17 LAB — TSH: TSH: 1.57 u[IU]/mL (ref 0.450–4.500)

## 2021-08-22 ENCOUNTER — Telehealth: Payer: Self-pay | Admitting: Medical

## 2021-08-22 NOTE — Telephone Encounter (Signed)
[facility] requested records received

## 2021-08-22 NOTE — Telephone Encounter (Signed)
[facility] requested records received  

## 2021-08-23 ENCOUNTER — Encounter: Payer: Self-pay | Admitting: Internal Medicine

## 2021-08-29 ENCOUNTER — Encounter: Payer: Self-pay | Admitting: Medical

## 2021-10-23 ENCOUNTER — Encounter: Payer: Self-pay | Admitting: Internal Medicine

## 2021-11-26 ENCOUNTER — Encounter: Payer: Self-pay | Admitting: Internal Medicine

## 2021-12-04 ENCOUNTER — Encounter: Payer: Self-pay | Admitting: Medical

## 2022-02-25 ENCOUNTER — Encounter: Payer: Self-pay | Admitting: Internal Medicine

## 2022-08-01 ENCOUNTER — Encounter: Payer: Self-pay | Admitting: Internal Medicine

## 2022-08-01 LAB — HM PAP SMEAR: HM Pap smear: NORMAL

## 2022-08-13 ENCOUNTER — Telehealth: Payer: Self-pay | Admitting: Medical

## 2022-08-13 NOTE — Telephone Encounter (Signed)
Medical Records received from [facility]

## 2022-08-14 ENCOUNTER — Encounter: Payer: Self-pay | Admitting: Internal Medicine

## 2022-08-22 ENCOUNTER — Encounter: Payer: BC Managed Care – PPO | Admitting: Medical

## 2022-08-25 ENCOUNTER — Encounter: Payer: Self-pay | Admitting: Medical

## 2022-08-25 ENCOUNTER — Ambulatory Visit (INDEPENDENT_AMBULATORY_CARE_PROVIDER_SITE_OTHER): Payer: BC Managed Care – PPO | Admitting: Medical

## 2022-08-25 VITALS — BP 120/70 | HR 80 | Ht 65.0 in | Wt 177.4 lb

## 2022-08-25 DIAGNOSIS — L509 Urticaria, unspecified: Secondary | ICD-10-CM | POA: Diagnosis not present

## 2022-08-25 DIAGNOSIS — Z0000 Encounter for general adult medical examination without abnormal findings: Secondary | ICD-10-CM

## 2022-08-25 DIAGNOSIS — Z13220 Encounter for screening for lipoid disorders: Secondary | ICD-10-CM | POA: Diagnosis not present

## 2022-08-25 DIAGNOSIS — Z789 Other specified health status: Secondary | ICD-10-CM

## 2022-08-25 DIAGNOSIS — Z1329 Encounter for screening for other suspected endocrine disorder: Secondary | ICD-10-CM

## 2022-08-25 LAB — COMPREHENSIVE METABOLIC PANEL
AST: 61 IU/L — ABNORMAL HIGH (ref 0–40)
Alkaline Phosphatase: 83 IU/L (ref 44–121)
BUN: 9 mg/dL (ref 6–20)
Bilirubin Total: 0.4 mg/dL (ref 0.0–1.2)
Calcium: 9.2 mg/dL (ref 8.7–10.2)
Chloride: 104 mmol/L (ref 96–106)
Globulin, Total: 3.2 g/dL (ref 1.5–4.5)
Potassium: 4.7 mmol/L (ref 3.5–5.2)

## 2022-08-25 LAB — LIPID PANEL
Cholesterol, Total: 175 mg/dL (ref 100–199)
HDL: 43 mg/dL (ref >39)
VLDL Cholesterol Cal: 21 mg/dL (ref 5–40)

## 2022-08-25 LAB — CBC
Hematocrit: 39.8 % (ref 34.0–46.6)
MCHC: 32.2 g/dL (ref 31.5–35.7)

## 2022-08-25 LAB — TSH

## 2022-08-25 NOTE — Progress Notes (Signed)
Subjective: Chief Complaint  Patient presents with   Annual Exam    Nonfasting cpe, breaking out in hives for the last year- would like a referral to allergist.    Medical team: Sees dentist, gynecology, Dr. Philip Aspen, [facility] Daun Rens, Kermit Balo, PA-C here for primary care   Concerns In the past year has had recurrent hives.  Thinks its related to something she eats.   Will gets hives /whelps on face around eyes.  Thinks it could be related to tomato or other.   Working as a Engineer, civil (consulting) at [employer].  She is on contraception  Goes to exercise classes 2-3 days per week  Reviewed their medical, surgical, family, social, medication, and allergy history and updated chart as appropriate.  Past Medical History:  Diagnosis Date   Allergy 03/2021   Think i may be allergic to tomatos     Past Surgical History:  Procedure Laterality Date   NO PAST SURGERIES  07/2021    Social History   Socioeconomic History   Marital status: Single    Spouse name: Not on file   Number of children: Not on file   Years of education: Not on file   Highest education level: Not on file  Occupational History   Not on file  Tobacco Use   Smoking status: Never   Smokeless tobacco: Never  Vaping Use   Vaping Use: Never used  Substance and Sexual Activity   Alcohol use: Yes    Comment: occasional   Drug use: No   Sexual activity: Yes    Birth control/protection: Condom, Pill  Other Topics Concern   Not on file  Social History Narrative   Went to [place].   Graduated [employer] nursing program 2023.  Working at [employer]  Exercise - plays basketball, goes to the gym.  08/2022   Social Determinants of Health   Financial Resource Strain: Low Risk  (08/25/2022)   Overall Financial Resource Strain (CARDIA)    Difficulty of Paying Living Expenses: Not hard at all  Food Insecurity: No Food Insecurity (08/25/2022)   Hunger Vital Sign    Worried  About Running Out of Food in the Last Year: Never true    Ran Out of Food in the Last Year: Never true  Transportation Needs: No Transportation Needs (08/25/2022)   PRAPARE - Administrator, Civil Service (Medical): No    Lack of Transportation (Non-Medical): No  Physical Activity: Insufficiently Active (08/25/2022)   Exercise Vital Sign    Days of Exercise per Week: 3 days    Minutes of Exercise per Session: 40 min  Stress: No Stress Concern Present (08/25/2022)   [employer] of Occupational Health - Occupational Stress Questionnaire    Feeling of Stress : Not at all  Social Connections: Moderately Integrated (08/25/2022)   Social Connection and Isolation Panel [NHANES]    Frequency of Communication with Friends and Family: More than three times a week    Frequency of Social Gatherings with Friends and Family: Once a week    Attends Religious Services: More than 4 times per year    Active Member of [employer] or Organizations: Yes    Attends Banker Meetings: 1 to 4 times per year    Marital Status: Never married  Intimate Partner Violence: Not At Risk (08/25/2022)   Humiliation, Afraid, Rape, and Kick questionnaire    Fear of Current or Ex-Partner: No  Emotionally Abused: No    Physically Abused: No    Sexually Abused: No    Family History  Problem Relation Age of Onset   Sickle cell trait Mother    Diabetes Maternal Grandfather    Heart disease Paternal Grandfather        heart transplant   Cancer Neg Hx      Current Outpatient Medications:    LARIN FE 1/20 1-20 MG-MCG tablet, Take 1 tablet by mouth daily., Disp: , Rfl:   Allergies  Allergen Reactions   Sulfa Drugs Cross Reactors Hives   Review of Systems  Constitutional:  Negative for chills, fever, malaise/fatigue and weight loss.  HENT:  Negative for congestion, ear pain, hearing loss, sore throat and tinnitus.   Eyes:  Negative for blurred vision, pain and redness.  Respiratory:  Negative for  cough, hemoptysis and shortness of breath.   Cardiovascular:  Negative for chest pain, palpitations, orthopnea, claudication and leg swelling.  Gastrointestinal:  Negative for abdominal pain, blood in stool, constipation, diarrhea, nausea and vomiting.  Genitourinary:  Negative for dysuria, flank pain, frequency, hematuria and urgency.  Musculoskeletal:  Negative for falls, joint pain and myalgias.  Skin:  Positive for rash. Negative for itching.       hives  Neurological:  Negative for dizziness, tingling, speech change, weakness and headaches.  Endo/Heme/Allergies:  Negative for polydipsia. Does not bruise/bleed easily.  Psychiatric/Behavioral:  Negative for depression and memory loss. The patient is not nervous/anxious and does not have insomnia.        Objective:  BP 120/70   Pulse 80   Ht 5\' 5"  (1.651 m)   Wt 177 lb 6.4 oz (80.5 kg)   BMI 29.52 kg/m   Wt Readings from Last 3 Encounters:  08/25/22 177 lb 6.4 oz (80.5 kg)  08/16/21 190 lb 9.6 oz (86.5 kg)  08/08/20 173 lb 6.4 oz (78.7 kg)    General appearance: alert, no distress, WD/WN, African American female Skin: Normal neck: supple, no lymphadenopathy, no thyromegaly, no masses, normal ROM, no bruits Chest: non tender, normal shape and expansion Heart: RRR, normal S1, S2, no murmurs Lungs: CTA bilaterally, no wheezes, rhonchi, or rales Abdomen: +bs, soft, non tender, non distended, no masses, no hepatomegaly, no splenomegaly, no bruits Back: non tender, normal ROM, no scoliosis Musculoskeletal: upper extremities non tender, no obvious deformity, normal ROM throughout, lower extremities non tender, no obvious deformity, normal ROM throughout Extremities: no edema, no cyanosis, no clubbing Pulses: 2+ symmetric, upper and lower extremities, normal cap refill Neurological: alert, oriented x 3, CN2-12 intact, strength normal upper extremities and lower extremities, sensation normal throughout, DTRs 2+ throughout, no  cerebellar signs, gait normal Psychiatric: normal affect, behavior normal, pleasant  Breast/gyn/rectal - deferred to gynecology     Assessment and Plan :   Encounter Diagnoses  Name Primary?   Routine general medical examination at a health care facility Yes   Uses birth control    Hives    Screening for lipid disorders    Screening for thyroid disorder    This visit was a preventative care visit, also known as wellness visit or routine physical.   Topics typically include healthy lifestyle, diet, exercise, preventative care, vaccinations, sick and well care, proper use of emergency dept and after hours care, as well as other concerns.     Recommendations: Continue to return yearly for your annual wellness and preventative care visits.  This gives [place] a chance to discuss healthy lifestyle, exercise, vaccinations,  review your chart record, and perform screenings where appropriate.  I recommend you see your eye doctor yearly for routine vision care.  I recommend you see your dentist yearly for routine dental care including hygiene visits twice yearly.   Vaccination recommendations were reviewed Immunization History  Administered Date(s) Administered   DTaP 05/17/1998, 07/17/1998, 09/20/1998, 08/05/1999, 03/27/2003   HIB (PRP-OMP) 05/17/1998, 07/17/1998, 09/20/1998, 08/05/1999   Hepatitis B 05/17/1998, 07/17/1998, 02/07/1999   Hpv-Unspecified 05/22/2016, 07/21/2016, 11/24/2016   IPV 05/17/1998, 07/17/1998, 02/07/1999, 03/27/2003   Influenza Split 11/20/2021   Influenza,inj,Quad PF,6+ Mos 05/12/2016, 11/03/2020   MMR 05/03/1999, 03/27/2003   Meningococcal B, OMV 09/18/2016, 09/25/2017   Meningococcal Mcv4o 09/18/2016, 08/08/2019   Moderna SARS-COV2 Booster Vaccination 04/06/2020   Moderna Sars-Covid-2 Vaccination 05/05/2019, 06/07/2019   Pneumococcal Conjugate-13 07/17/1998, 09/20/1998, 02/07/1999, 05/03/1999   Tdap 09/19/2009, 08/08/2019   Varicella 05/03/1999, 09/18/2016     Screening for cancer: Colon cancer screening: Age 49  Breast cancer screening: You should perform a self breast exam monthly   Baseline mammogram at age 3  Cervical cancer screening: We reviewed recommendations for pap smear screening.   Skin cancer screening: Check your skin regularly for new changes, growing lesions, or other lesions of concern Come in for evaluation if you have skin lesions of concern.  Lung cancer screening: If you have a greater than 20 pack year history of tobacco use, then you may qualify for lung cancer screening with a chest CT scan.   Please call your insurance company to inquire about coverage for this test.  We currently don't have screenings for other cancers besides breast, cervical, colon, and lung cancers.  If you have a strong family history of cancer or have other cancer screening concerns, please let me know.    Bone health: Get at least 150 minutes of aerobic exercise weekly Get weight bearing exercise at least once weekly Bone density test:  A bone density test is an imaging test that uses a type of X-ray to measure the amount of calcium and other minerals in your bones. The test may be used to diagnose or screen you for a condition that causes weak or thin bones (osteoporosis), predict your risk for a broken bone (fracture), or determine how well your osteoporosis treatment is working. The bone density test is recommended for females 65 and older, or females or males <65 if certain risk factors such as thyroid disease, long term use of steroids such as for asthma or rheumatological issues, vitamin D deficiency, estrogen deficiency, family history of osteoporosis, self or family history of fragility fracture in first degree relative.   Heart health: Get at least 150 minutes of aerobic exercise weekly Limit alcohol It is important to maintain a healthy blood pressure and healthy cholesterol numbers  Heart disease screening: Screening for  heart disease includes screening for blood pressure, fasting lipids, glucose/diabetes screening, BMI height to weight ratio, reviewed of smoking status, physical activity, and diet.    Goals include blood pressure 120/80 or less, maintaining a healthy lipid/cholesterol profile, preventing diabetes or keeping diabetes numbers under good control, not smoking or using tobacco products, exercising most days per week or at least 150 minutes per week of exercise, and eating healthy variety of fruits and vegetables, healthy oils, and avoiding unhealthy food choices like fried food, fast food, high sugar and high cholesterol foods.     Medical care options: I recommend you continue to seek care here first for routine care.  We try really hard to  have available appointments Monday through Friday daytime hours for sick visits, acute visits, and physicals.  Urgent care should be used for after hours and weekends for significant issues that cannot wait till the next day.  The emergency department should be used for significant potentially life-threatening emergencies.  The emergency department is expensive, can often have long wait times for less significant concerns, so try to utilize primary care, urgent care, or telemedicine when possible to avoid unnecessary trips to the emergency department.  Virtual visits and telemedicine have been introduced since the pandemic started in 2020, and can be convenient ways to receive medical care.  We offer virtual appointments as well to assist you in a variety of options to seek medical care.   Other issues: Hives - referral to allergist.  Keep a symptom diary in the meantime.  On contraception per gynecology    Regina Robinson was seen today for annual exam.  Diagnoses and all orders for this visit:  Routine general medical examination at a health care facility -     Comprehensive metabolic panel -     CBC -     TSH -     Lipid panel  Uses birth control  Hives -      Ambulatory referral to Allergy  Screening for lipid disorders -     Lipid panel  Screening for thyroid disorder -     TSH   Follow-up pending labs, yearly for physical

## 2022-08-26 ENCOUNTER — Other Ambulatory Visit: Payer: Self-pay | Admitting: Medical

## 2022-08-26 DIAGNOSIS — R7989 Other specified abnormal findings of blood chemistry: Secondary | ICD-10-CM

## 2022-08-26 DIAGNOSIS — R748 Abnormal levels of other serum enzymes: Secondary | ICD-10-CM

## 2022-08-26 LAB — COMPREHENSIVE METABOLIC PANEL
ALT: 69 IU/L — ABNORMAL HIGH (ref 0–32)
Albumin: 4 g/dL (ref 4.0–5.0)
BUN/Creatinine Ratio: 8 — ABNORMAL LOW (ref 9–23)
CO2: 21 mmol/L (ref 20–29)
Creatinine, Ser: 1.07 mg/dL — ABNORMAL HIGH (ref 0.57–1.00)
Glucose: 90 mg/dL (ref 70–99)
Sodium: 137 mmol/L (ref 134–144)
Total Protein: 7.2 g/dL (ref 6.0–8.5)
eGFR: 74 mL/min/{1.73_m2} (ref >59)

## 2022-08-26 LAB — CBC
Hemoglobin: 12.8 g/dL (ref 11.1–15.9)
MCH: 26.6 pg (ref 26.6–33.0)
MCV: 83 fL (ref 79–97)
Platelets: 305 10*3/uL (ref 150–450)
RBC: 4.81 x10E6/uL (ref 3.77–5.28)
RDW: 14.8 % (ref 11.7–15.4)
WBC: 8.1 10*3/uL (ref 3.4–10.8)

## 2022-08-26 LAB — LIPID PANEL
Chol/HDL Ratio: 4.1 ratio (ref 0.0–4.4)
LDL Chol Calc (NIH): 111 mg/dL — ABNORMAL HIGH (ref 0–99)
Triglycerides: 116 mg/dL (ref 0–149)

## 2022-08-26 NOTE — Progress Notes (Signed)
Surprisingly your labs show elevated liver test.  Your kidney marker is slightly elevated but stable as it was the last few times.  Your electrolytes are normal.  Cholesterol okay, blood counts okay, thyroid okay.  Did you drink alcohol the last few days prior to your visit?  We will need to recheck the liver test in the near future as well as a urinalysis to check for protein in the urine.    Plan a recheck nurse visit for labs in 3 to 4 weeks

## 2022-09-02 ENCOUNTER — Encounter: Payer: Self-pay | Admitting: Allergy & Immunology

## 2022-09-02 ENCOUNTER — Ambulatory Visit: Payer: BC Managed Care – PPO | Admitting: Allergy & Immunology

## 2022-09-02 VITALS — BP 124/86 | HR 76 | Temp 99.1°F | Resp 16 | Ht 64.25 in | Wt 175.0 lb

## 2022-09-02 DIAGNOSIS — L500 Allergic urticaria: Secondary | ICD-10-CM

## 2022-09-02 DIAGNOSIS — J302 Other seasonal allergic rhinitis: Secondary | ICD-10-CM | POA: Diagnosis not present

## 2022-09-02 DIAGNOSIS — J3089 Other allergic rhinitis: Secondary | ICD-10-CM

## 2022-09-02 NOTE — Patient Instructions (Addendum)
1. Allergic urticaria - Testing was positive to casein, cashew, pistachio, and tomato. - I would just avoid all for now just to be on the safe side. - We are going to get some lab work to clarify this a bit more. - I am not going to get any rheumatologic blood work since he did not have any joint pain, fevers, or permanent skin changes. - I would start Zyrtec (cetirizine) 10 mg once a day to suppress the hives in the meantime.  2. Seasonal and perennial allergic rhinitis - Testing today showed: grasses, ragweed, weeds, trees, and dust mites - Copy of test results provided.  - Avoidance measures provided. - Start taking: Zyrtec (cetirizine) 10mg  tablet once daily - You can use an extra dose of the antihistamine, if needed, for breakthrough symptoms.  - Consider nasal saline rinses 1-2 times daily to remove allergens from the nasal cavities as well as help with mucous clearance (this is especially helpful to do before the nasal sprays are given) - Consider allergy shots as a means of long-term control. - Allergy shots "re-train" and "reset" the immune system to ignore environmental allergens and decrease the resulting immune response to those allergens (sneezing, itchy watery eyes, runny nose, nasal congestion, etc).    - Allergy shots improve symptoms in 75-85% of patients.  - We can discuss more at the next appointment if the medications are not working for you. - I do not think we need to start allergy shots at this point since symptoms seem under fair control.  3. Return in about 2 months (around 11/03/2022). You can have the follow up appointment with Dr. Dellis Anes or a Nurse Practicioner (our Nurse Practitioners are excellent and always have Physician oversight!).    Please inform us of any Emergency Department visits, hospitalizations, or changes in symptoms. Call us before going to the ED for breathing or allergy symptoms since we might be able to fit you in for a sick visit. Feel free to  contact us anytime with any questions, problems, or concerns.  It was a pleasure to meet you today!  Websites that have reliable patient information: 1. American Academy of Asthma, Allergy, and Immunology: www.aaaai.org 2. Food Allergy Research and Education (FARE): foodallergy.org 3. Mothers of Asthmatics: http://www.asthmacommunitynetwork.org 4. American College of Allergy, Asthma, and Immunology: www.acaai.org   COVID-19 Vaccine Information can be found at: [email or web address] For questions related to vaccine distribution or appointments, please email [email or web address] or call [phone].   We realize that you might be concerned about having an allergic reaction to the COVID19 vaccines. To help with that concern, WE ARE OFFERING THE COVID19 VACCINES IN OUR OFFICE! Ask the front desk for dates!     "Like" [place] on Facebook and Instagram for our latest updates!      A healthy democracy works best when [employer] participate! Make sure you are registered to vote! If you have moved or changed any of your contact information, you will need to get this updated before voting!  In some cases, you MAY be able to register to vote online: [email or web address]     Airborne Adult Perc - 09/02/22 1406     Time Antigen Placed 1406    Allergen Manufacturer Waynette Buttery    Location Back    Number of Test 55    1. Control-Buffer 50% Glycerol Negative    2. Control-Histamine 2+    3. Bahia 4+    4. [place] 4+    5. Johnson 4+  6. [place] Blue 4+    7. Meadow Fescue 4+    8. Perennial Rye 4+    9. Timothy 4+    10. Ragweed Mix 4+    11. Cocklebur 2+    12. Plantain,  English 2+    13. Baccharis 2+    14. Dog Fennel Negative    15. Guernsey Thistle 2+    16. Lamb's Quarters Negative    17. Sheep Sorrell 3+    18. Rough Pigweed 2+    19. Marsh Elder, Rough 3+    20. Mugwort, Common 3+    21. Box,  Elder Negative    22. Cedar, red Negative    23. Sweet Gum Negative    24. Pecan Pollen Negative    25. Pine Mix 3+    26. Walnut, Black Pollen 2+    27. Red Mulberry 2+    28. Ash Mix 2+    29. Birch Mix Negative    30. Beech American 3+    31. Cottonwood, [place] 2+    32. Hickory, White Negative    33. Maple Mix Negative    34. Oak, [place] Mix Negative    35. Sycamore Eastern Negative    36. Alternaria Alternata Negative    37. Cladosporium Herbarum Negative    38. Aspergillus Mix Negative    39. Penicillium Mix Negative    40. Bipolaris Sorokiniana (Helminthosporium) Negative    41. Drechslera Spicifera (Curvularia) Negative    42. Mucor Plumbeus Negative    43. Fusarium Moniliforme Negative    44. Aureobasidium Pullulans (pullulara) Negative    45. Rhizopus Oryzae Negative    46. Botrytis Cinera Negative    47. Epicoccum Nigrum Negative    48. Phoma Betae Negative    49. Dust Mite Mix 2+    50. Cat Hair 10,000 BAU/ml Negative    51.  Dog Epithelia Negative    52. Mixed Feathers Negative    53. Horse Epithelia Negative    54. Cockroach, German Negative    55. Tobacco Leaf Negative             Food Adult Perc - 09/02/22 1400     Time Antigen Placed 1406    Allergen Manufacturer Waynette Buttery    Location Back    Number of allergen test 18    1. Peanut Negative    2. Soybean Negative    3. Wheat Negative    4. Sesame Negative    5. Milk, Cow Negative    6. Casein --   7 x 10 (eats)   7. Egg White, Chicken Negative    8. Shellfish Mix Negative    9. Fish Mix Negative    10. Cashew --   5 x 7 (eats)   11. Walnut Food Negative    12. Almond Negative    13. Hazelnut Negative    14. Pecan Food Negative    15. Pistachio --   7 x 10 (eats)   16. [place] Nut Negative    17. Coconut Negative    38. Tomato --   6 x 8 (stopped eating recently)           Reducing Pollen Exposure  The American Academy of Allergy, Asthma and Immunology suggests the following steps  to reduce your exposure to pollen during allergy seasons.    Do not hang sheets or clothing out to dry; pollen may collect on these items. Do not mow lawns or spend  time around freshly cut grass; mowing stirs up pollen. Keep windows closed at night.  Keep car windows closed while driving. Minimize morning activities outdoors, a time when pollen counts are usually at their highest. Stay indoors as much as possible when pollen counts or humidity is high and on windy days when pollen tends to remain in the air longer. Use air conditioning when possible.  Many air conditioners have filters that trap the pollen spores. Use a HEPA room air filter to remove pollen form the indoor air you breathe.  Control of Dust Mite Allergen    Dust mites play a major role in allergic asthma and rhinitis.  They occur in environments with high humidity wherever human skin is found.  Dust mites absorb humidity from the atmosphere (ie, they do not drink) and feed on organic matter (including shed human and animal skin).  Dust mites are a microscopic type of insect that you cannot see with the naked eye.  High levels of dust mites have been detected from mattresses, pillows, carpets, upholstered furniture, bed covers, clothes, soft toys and any woven material.  The principal allergen of the dust mite is found in its feces.  A gram of dust may contain 1,000 mites and 250,000 fecal particles.  Mite antigen is easily measured in the air during house cleaning activities.  Dust mites do not bite and do not cause harm to humans, other than by triggering allergies/asthma.    Ways to decrease your exposure to dust mites in your home:  Encase mattresses, box springs and pillows with a mite-impermeable barrier or cover   Wash sheets, blankets and drapes weekly in hot water (130 F) with detergent and dry them in a dryer on the hot setting.  Have the room cleaned frequently with a vacuum cleaner and a damp dust-mop.  For carpeting or  rugs, vacuuming with a vacuum cleaner equipped with a high-efficiency particulate air (HEPA) filter.  The dust mite allergic individual should not be in a room which is being cleaned and should wait 1 hour after cleaning before going into the room. Do not sleep on upholstered furniture (eg, couches).   If possible removing carpeting, upholstered furniture and drapery from the home is ideal.  Horizontal blinds should be eliminated in the rooms where the person spends the most time (bedroom, study, television room).  Washable vinyl, roller-type shades are optimal. Remove all non-washable stuffed toys from the bedroom.  Wash stuffed toys weekly like sheets and blankets above.   Reduce indoor humidity to less than 50%.  Inexpensive humidity monitors can be purchased at most hardware stores.  Do not use a humidifier as can make the problem worse and are not recommended.

## 2022-09-02 NOTE — Progress Notes (Unsigned)
NEW PATIENT  Date of Service/Encounter:  09/02/22  Consult requested by: Regina Canavan, PA-C   Assessment:   Allergic urticaria - with testing that is positive to casein, cashew, pistachio, and tomato  Seasonal and perennial allergic rhinitis (grasses, ragweed, weeds, trees, and dust mites)  Plan/Recommendations:    1. Allergic urticaria - Testing was positive to casein, cashew, pistachio, and tomato. - I would just avoid all for now just to be on the safe side. - We are going to get some lab work to clarify this a bit more. - I am not going to get any rheumatologic blood work since he did not have any joint pain, fevers, or permanent skin changes. - I would start Zyrtec (cetirizine) 10 mg once a day to suppress the hives in the meantime.  2. Seasonal and perennial allergic rhinitis - Testing today showed: grasses, ragweed, weeds, trees, and dust mites - Copy of test results provided.  - Avoidance measures provided. - Start taking: Zyrtec (cetirizine) 10mg  tablet once daily - You can use an extra dose of the antihistamine, if needed, for breakthrough symptoms.  - Consider nasal saline rinses 1-2 times daily to remove allergens from the nasal cavities as well as help with mucous clearance (this is especially helpful to do before the nasal sprays are given) - Consider allergy shots as a means of long-term control. - Allergy shots "re-train" and "reset" the immune system to ignore environmental allergens and decrease the resulting immune response to those allergens (sneezing, itchy watery eyes, runny nose, nasal congestion, etc).    - Allergy shots improve symptoms in 75-85% of patients.  - We can discuss more at the next appointment if the medications are not working for you. - I do not think we need to start allergy shots at this point since symptoms seem under fair control.  3. Return in about 2 months (around 11/03/2022). You can have the follow up appointment with Dr.  Dellis Anes or a Nurse Practicioner (our Nurse Practitioners are excellent and always have Physician oversight!).    This note in its entirety was forwarded to the Provider who requested this consultation.  Subjective:   Regina Robinson is a 22 y.o. female presenting today for evaluation of  Chief Complaint  Patient presents with   Urticaria    X 1 year after certain foods, possibly tomato? Hives on face. No swelling. Chest feels funny.     Regina Robinson has a history of the following: Patient Active Problem List   Diagnosis Date Noted   Encounter for health maintenance examination in adult 08/08/2019   Screening for tuberculosis 08/08/2019   Screening for lipid disorders 08/08/2019   Screening for deficiency anemia 08/08/2019   Need for Tdap vaccination 08/08/2019   Need for meningitis vaccination 08/08/2019   Screening for condition 08/08/2019   Adverse exposure in workplace 08/08/2019   Routine general medical examination at a health care facility 03/30/2018   Need for meningococcus vaccine 03/30/2018   Tinea versicolor 01/04/2011    History obtained from: chart review and patient.  Regina Robinson was referred by Regina Canavan, PA-C.     Regina Robinson is a 22 y.o. female presenting for an evaluation of urticaria .   This started one year ago. She thought that this was random, but then she started to correlate them with tomatoes, but she is not entirely sure. They are always isolated to her face. She does not wear cosmetics and she has not  changed it recently. It leaves normal skin when it clears up. No one else in the family has it at all. She would eat a lot of Chiptole and eat salsa. But she was not keeping a diary.   She eats peanuts, cashews, cow's milk, wheat, eggs, and fish. She does not avoid soy at all. She does tolerate sesame without a problem.   She is a Designer, jewellery. It is not worse at work versus home. She did report that she had some chest tightness  once, but it is was transient. There is no association with any exposure. t    Allergic Rhinitis Symptom History: She does have some environmental allergies that were worse when she was younger. It has gotten better over the years. She has watery eyes occasionally, but nothing too serious. She grew up in [place] and then moved to [place].   Otherwise, there is no history of other atopic diseases, including drug allergies, stinging insect allergies, or contact dermatitis. There is no significant infectious history. Vaccinations are up to date.    Past Medical History: Patient Active Problem List   Diagnosis Date Noted   Encounter for health maintenance examination in adult 08/08/2019   Screening for tuberculosis 08/08/2019   Screening for lipid disorders 08/08/2019   Screening for deficiency anemia 08/08/2019   Need for Tdap vaccination 08/08/2019   Need for meningitis vaccination 08/08/2019   Screening for condition 08/08/2019   Adverse exposure in workplace 08/08/2019   Routine general medical examination at a health care facility 03/30/2018   Need for meningococcus vaccine 03/30/2018   Tinea versicolor 01/04/2011    Medication List:  Allergies as of 09/02/2022       Reactions   Sulfa Drugs Cross Reactors Hives        Medication List        Accurate as of September 02, 2022 11:59 PM. If you have any questions, ask your nurse or doctor.          Larin Fe 1/20 1-20 MG-MCG tablet Generic drug: norethindrone-ethinyl estradiol-FE Take 1 tablet by mouth daily. Junel-Iron        Birth History: non-contributory  Developmental History: non-contributory  Past Surgical History: Past Surgical History:  Procedure Laterality Date   NO PAST SURGERIES  07/2021     Family History: Family History  Problem Relation Age of Onset   Sickle cell trait Mother    Diabetes Maternal Grandfather    Heart disease Paternal Grandfather        heart transplant   Cancer Neg Hx     Asthma Neg Hx    Allergic rhinitis Neg Hx    Immunodeficiency Neg Hx    Eczema Neg Hx    Urticaria Neg Hx    Angioedema Neg Hx      Social History: Regina Robinson lives at home with her family.  Lives in a house.  It is 24 years old.  There is carpeting throughout the home.  There is no mildew damage.  She has electric heating and central cooling.  There are no animals inside her side of the home.  She does have dust mite covers on her bed, but not her house.  There is no tobacco exposure.  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  [place]   Review of systems otherwise negative other than that mentioned in the HPI.    Objective:   Blood pressure  124/86, pulse 76, temperature 99.1 F (37.3 C), temperature source Temporal, resp. rate 16, height 5' 4.25" (1.632 m), weight 175 lb (79.4 kg), SpO2 98%. Body mass index is 29.81 kg/m.     Physical Exam Vitals reviewed.  Constitutional:      Appearance: She is well-developed.     Comments: Very pleasant.  Cooperative with the exam.  HENT:     Head: Normocephalic and atraumatic.     Right Ear: Tympanic membrane, ear canal and external ear normal. No drainage, swelling or tenderness. Tympanic membrane is not injected, scarred, erythematous, retracted or bulging.     Left Ear: Tympanic membrane, ear canal and external ear normal. No drainage, swelling or tenderness. Tympanic membrane is not injected, scarred, erythematous, retracted or bulging.     Nose: No nasal deformity, septal deviation, mucosal edema or rhinorrhea.     Right Turbinates: Swollen and pale. Not enlarged.     Left Turbinates: Swollen and pale. Not enlarged.     Right Sinus: No maxillary sinus tenderness or frontal sinus tenderness.     Left Sinus: No maxillary sinus tenderness or frontal sinus tenderness.     Mouth/Throat:     Mouth: Mucous membranes are not pale and not dry.     Pharynx:  Uvula midline.  Eyes:     General: Lids are normal. No allergic shiner.       Right eye: No discharge.        Left eye: No discharge.     Conjunctiva/sclera: Conjunctivae normal.     Right eye: Right conjunctiva is not injected. No chemosis.    Left eye: Left conjunctiva is not injected. No chemosis.    Pupils: Pupils are equal, round, and reactive to light.  Cardiovascular:     Rate and Rhythm: Normal rate and regular rhythm.     Heart sounds: Normal heart sounds.  Pulmonary:     Effort: Pulmonary effort is normal. No tachypnea, accessory muscle usage or respiratory distress.     Breath sounds: Normal breath sounds. No wheezing, rhonchi or rales.  Chest:     Chest wall: No tenderness.  Abdominal:     Tenderness: There is no abdominal tenderness. There is no guarding or rebound.  Lymphadenopathy:     Head:     Right side of head: No submandibular, tonsillar or occipital adenopathy.     Left side of head: No submandibular, tonsillar or occipital adenopathy.     Cervical: No cervical adenopathy.  Skin:    General: Skin is warm.     Capillary Refill: Capillary refill takes less than 2 seconds.     Coloration: Skin is not pale.     Findings: No abrasion, erythema, petechiae or rash. Rash is not papular, urticarial or vesicular.     Comments: No urticaria noted.  No dermatographia noted.   Neurological:     Mental Status: She is alert.  Psychiatric:        Behavior: Behavior is cooperative.      Diagnostic studies:   Allergy Studies:     Airborne Adult Perc - 09/02/22 1406     Time Antigen Placed 1406    Allergen Manufacturer Waynette Buttery    Location Back    Number of Test 55    1. Control-Buffer 50% Glycerol Negative    2. Control-Histamine 2+    3. Bahia 4+    4. [place] 4+    5. Johnson 4+    6. [place] Blue 4+  7. Meadow Fescue 4+    8. Perennial Rye 4+    9. Timothy 4+    10. Ragweed Mix 4+    11. Cocklebur 2+    12. Plantain,  English 2+    13. Baccharis 2+     14. Dog Fennel Negative    15. [place] Thistle 2+    16. Lamb's Quarters Negative    17. Sheep Sorrell 3+    18. Rough Pigweed 2+    19. Marsh Elder, Rough 3+    20. Mugwort, Common 3+    21. Box, Elder Negative    22. Cedar, red Negative    23. Sweet Gum Negative    24. Pecan Pollen Negative    25. Pine Mix 3+    26. Walnut, Black Pollen 2+    27. Red Mulberry 2+    28. Ash Mix 2+    29. Birch Mix Negative    30. Beech American 3+    31. Cottonwood, [place] 2+    32. Hickory, White Negative    33. Maple Mix Negative    34. Oak, [place] Mix Negative    35. Sycamore Eastern Negative    36. Alternaria Alternata Negative    37. Cladosporium Herbarum Negative    38. Aspergillus Mix Negative    39. Penicillium Mix Negative    40. Bipolaris Sorokiniana (Helminthosporium) Negative    41. Drechslera Spicifera (Curvularia) Negative    42. Mucor Plumbeus Negative    43. Fusarium Moniliforme Negative    44. Aureobasidium Pullulans (pullulara) Negative    45. Rhizopus Oryzae Negative    46. Botrytis Cinera Negative    47. Epicoccum Nigrum Negative    48. Phoma Betae Negative    49. Dust Mite Mix 2+    50. Cat Hair 10,000 BAU/ml Negative    51.  Dog Epithelia Negative    52. Mixed Feathers Negative    53. Horse Epithelia Negative    54. Cockroach, German Negative    55. Tobacco Leaf Negative             Food Adult Perc - 09/02/22 1400     Time Antigen Placed 1406    Allergen Manufacturer Waynette Buttery    Location Back    Number of allergen test 18    1. Peanut Negative    2. Soybean Negative    3. Wheat Negative    4. Sesame Negative    5. Milk, Cow Negative    6. Casein --   7 x 10 (eats)   7. Egg White, Chicken Negative    8. Shellfish Mix Negative    9. Fish Mix Negative    10. Cashew --   5 x 7 (eats)   11. Walnut Food Negative    12. Almond Negative    13. Hazelnut Negative    14. Pecan Food Negative    15. Pistachio --   7 x 10 (eats)   16. [place] Nut Negative     17. Coconut Negative    38. Tomato --   6 x 8 (stopped eating recently)            Allergy testing results were read and interpreted by myself, documented by clinical staff.         Malachi Bonds, MD [facility]

## 2022-09-03 ENCOUNTER — Encounter: Payer: Self-pay | Admitting: Allergy & Immunology

## 2022-09-05 LAB — ALLERGEN COMPONENT COMMENTS

## 2022-09-05 LAB — IGE NUT PROF. W/COMPONENT RFLX

## 2022-09-05 LAB — ALPHA-GAL PANEL
Beef IgE: <0.1 kU/L
IgE (Immunoglobulin E), Serum: 230 IU/mL (ref 6–495)
Pork IgE: <0.1 kU/L

## 2022-09-05 LAB — MILK COMPONENT PANEL
F076-IgE Alpha Lactalbumin: <0.1 kU/L
F078-IgE Casein: <0.1 kU/L

## 2022-09-06 LAB — PANEL 604726
Cor A 1 IgE: <0.1 kU/L
Cor A 14 IgE: <0.1 kU/L
Cor A 8 IgE: <0.1 kU/L
Cor A 9 IgE: 0.27 kU/L — AB

## 2022-09-06 LAB — IGE NUT PROF. W/COMPONENT RFLX
F018-IgE Brazil Nut: 0.2 kU/L — AB
F202-IgE Cashew Nut: 0.35 kU/L — AB
F203-IgE Pistachio Nut: 0.99 kU/L — AB
Macadamia Nut, IgE: 0.67 kU/L — AB
Peanut, IgE: 0.91 kU/L — AB
Pecan Nut IgE: 0.47 kU/L — AB

## 2022-09-06 LAB — PEANUT COMPONENTS
F352-IgE Ara h 8: <0.1 kU/L
F422-IgE Ara h 1: <0.1 kU/L
F423-IgE Ara h 2: <0.1 kU/L
F424-IgE Ara h 3: <0.1 kU/L
F427-IgE Ara h 9: <0.1 kU/L
F447-IgE Ara h 6: <0.1 kU/L

## 2022-09-06 LAB — TRYPTASE: Tryptase: 4.8 ug/L (ref 2.2–13.2)

## 2022-09-06 LAB — ALPHA-GAL PANEL
Allergen Lamb IgE: <0.1 kU/L
O215-IgE Alpha-Gal: <0.1 kU/L

## 2022-09-06 LAB — MILK COMPONENT PANEL: F077-IgE Beta Lactoglobulin: <0.1 kU/L

## 2022-09-06 LAB — PANEL 604721
Jug R 1 IgE: <0.1 kU/L
Jug R 3 IgE: <0.1 kU/L

## 2022-09-06 LAB — PANEL 604350: Ber E 1 IgE: <0.1 kU/L

## 2022-09-06 LAB — ALLERGEN, TOMATO F25: Allergen Tomato, IgE: 0.81 kU/L — AB

## 2022-09-06 LAB — PANEL 604239: ANA O 3 IgE: <0.1 kU/L

## 2022-09-22 ENCOUNTER — Other Ambulatory Visit: Payer: BC Managed Care – PPO

## 2022-09-23 ENCOUNTER — Other Ambulatory Visit: Payer: BC Managed Care – PPO

## 2022-09-23 DIAGNOSIS — R748 Abnormal levels of other serum enzymes: Secondary | ICD-10-CM

## 2022-09-23 LAB — IRON

## 2022-09-23 LAB — HEPATIC FUNCTION PANEL
ALT: 19 IU/L (ref 0–32)
AST: 24 IU/L (ref 0–40)
Albumin: 4.2 g/dL (ref 4.0–5.0)
Alkaline Phosphatase: 62 IU/L (ref 44–121)
Bilirubin Total: 0.5 mg/dL (ref 0.0–1.2)
Total Protein: 6.7 g/dL (ref 6.0–8.5)

## 2022-09-28 NOTE — Progress Notes (Signed)
Results sent through MyChart

## 2022-11-04 ENCOUNTER — Other Ambulatory Visit: Payer: Self-pay

## 2022-11-04 ENCOUNTER — Encounter: Payer: Self-pay | Admitting: Allergy & Immunology

## 2022-11-04 ENCOUNTER — Ambulatory Visit: Payer: BC Managed Care – PPO | Admitting: Allergy & Immunology

## 2022-11-04 VITALS — BP 120/76 | HR 94 | Temp 98.4°F | Resp 18 | Ht 64.57 in | Wt 177.6 lb

## 2022-11-04 DIAGNOSIS — J302 Other seasonal allergic rhinitis: Secondary | ICD-10-CM

## 2022-11-04 DIAGNOSIS — J3089 Other allergic rhinitis: Secondary | ICD-10-CM | POA: Diagnosis not present

## 2022-11-04 DIAGNOSIS — L500 Allergic urticaria: Secondary | ICD-10-CM

## 2022-11-04 MED ORDER — EPINEPHRINE 0.3 MG/0.3ML IJ SOAJ: 0.3000 mg | Freq: Once | INTRAMUSCULAR | 2 refills | Status: AC

## 2022-11-04 NOTE — Progress Notes (Signed)
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 Allergic urticaria - Continue to avoid tomato as you are doing. - I am fine with continuing to keep tree nuts and milk in your diet if you are doing fine.  - We will send in an EpiPen out of an abundance of caution.   2. Seasonal and perennial allergic rhinitis - Previous testing showed: grasses, ragweed, weeds, trees, and dust mites - Avoidance measures provided. - Continue taking as needed: Zyrtec (cetirizine) 10mg  tablet once daily - You can use an extra dose of the antihistamine, if needed, for breakthrough symptoms.  - I think we cal hold off on allergy shots for now.   3. Return in about 1 year (around 11/04/2023). You can have the follow up appointment with Dr. Dellis Anes or a Nurse Practicioner (our Nurse Practitioners are excellent and always have Physician oversight!).    Subjective:   Regina Robinson is a 22 y.o. female presenting today for follow up of  Chief Complaint  Patient presents with   Urticaria    One small incident but doing good     Regina Robinson has a history of the following: Patient Active Problem List   Diagnosis Date Noted   Encounter for health maintenance examination in adult 08/08/2019   Screening for tuberculosis 08/08/2019   Screening for lipid disorders 08/08/2019   Screening for deficiency anemia 08/08/2019   Need for Tdap vaccination 08/08/2019   Need for meningitis vaccination 08/08/2019   Screening for condition 08/08/2019   Adverse exposure in workplace 08/08/2019   Routine general medical examination at a health care facility 03/30/2018   Need for meningococcus  vaccine 03/30/2018   Tinea versicolor 01/04/2011    History obtained from: chart review and patient.  Regina Robinson is a 22 y.o. female presenting for a follow up visit.  She was last seen in July 2024.  At that time, testing was positive to casein, cashew, pistachio, and tomato.  We obtain the lab work to clarify this.  We recommended starting Zyrtec 10 mg daily to suppress the hives.  She had environmental allergy testing that was positive to pollens as well as dust mite.  We did talk about allergy shots for long-term control.  Since last visit, she has done pretty well.  Allergic Rhinitis Symptom History: Environmental allergies are under good control. She is not using an antihistamine daily. This  is working for her. She has not had antibiotics for any sinus or ear infection.  She has overall done very well.   Skin Symptom History: She did have a hive once since we saw her last time. She is avoiding all of the foods that we discussed.  Her skin has been under fairly good control.   Otherwise, there have been no changes to her past medical history, surgical history, family history, or social history.    Review of systems otherwise negative other than that mentioned in the HPI.    Objective:   Blood pressure 120/76, pulse 94, temperature 98.4 F (36.9 C), resp. rate 18, height 5' 4.57" (1.64 m), weight 177 lb 9.6 oz (80.6 kg), SpO2 98%. Body mass index is 29.95 kg/m.  Physical Exam Vitals reviewed.  Constitutional:      Appearance: She is well-developed.     Comments: Very pleasant.  Cooperative with the exam.  HENT:     Head: Normocephalic and atraumatic.     Right Ear: Tympanic membrane, ear canal and external ear normal. No drainage, swelling or tenderness. Tympanic membrane is not injected, scarred, erythematous, retracted or bulging.     Left Ear: Tympanic membrane, ear canal and external ear normal. No drainage, swelling or tenderness. Tympanic membrane is not injected,  scarred, erythematous, retracted or bulging.     Nose: No nasal deformity, septal deviation, mucosal edema or rhinorrhea.     Right Turbinates: Swollen and pale. Not enlarged.     Left Turbinates: Swollen and pale. Not enlarged.     Right Sinus: No maxillary sinus tenderness or frontal sinus tenderness.     Left Sinus: No maxillary sinus tenderness or frontal sinus tenderness.     Mouth/Throat:     Mouth: Mucous membranes are not pale and not dry.     Pharynx: Uvula midline.  Eyes:     General: Lids are normal. No allergic shiner.       Right eye: No discharge.        Left eye: No discharge.     Conjunctiva/sclera: Conjunctivae normal.     Right eye: Right conjunctiva is not injected. No chemosis.    Left eye: Left conjunctiva is not injected. No chemosis.    Pupils: Pupils are equal, round, and reactive to light.  Cardiovascular:     Rate and Rhythm: Normal rate and regular rhythm.     Heart sounds: Normal heart sounds.  Pulmonary:     Effort: Pulmonary effort is normal. No tachypnea, accessory muscle usage or respiratory distress.     Breath sounds: Normal breath sounds. No wheezing, rhonchi or rales.     Comments: Moving air well in all lung fields. No increased work of breathing noted.  Chest:     Chest wall: No tenderness.  Abdominal:     Tenderness: There is no abdominal tenderness. There is no guarding or rebound.  Lymphadenopathy:     Head:     Right side of head: No submandibular, tonsillar or occipital adenopathy.     Left side of head: No submandibular, tonsillar or occipital adenopathy.     Cervical: No cervical adenopathy.  Skin:    General: Skin is warm.     Capillary Refill: Capillary refill takes less than 2 seconds.     Coloration: Skin is not pale.     Findings: No abrasion, erythema, petechiae or rash. Rash is not papular, urticarial or vesicular.     Comments: No urticaria noted.  No dermatographia noted.   Neurological:     Mental Status: She is alert.   Psychiatric:        Behavior: Behavior is cooperative.      Diagnostic studies: none       Malachi Bonds, MD  [facility]

## 2022-11-04 NOTE — Patient Instructions (Addendum)
1. Allergic urticaria - Continue to avoid tomato as you are doing. - I am fine with continuing to keep tree nuts and milk in your diet if you are doing fine.  - We will send in an EpiPen out of an abundance of caution.   2. Seasonal and perennial allergic rhinitis - Previous testing showed: grasses, ragweed, weeds, trees, and dust mites - Avoidance measures provided. - Continue taking as needed: Zyrtec (cetirizine) 10mg  tablet once daily - You can use an extra dose of the antihistamine, if needed, for breakthrough symptoms.  - I think we cal hold off on allergy shots for now.   3. Return in about 1 year (around 11/04/2023). You can have the follow up appointment with Dr. Dellis Anes or a Nurse Practicioner (our Nurse Practitioners are excellent and always have Physician oversight!).    Please inform us of any Emergency Department visits, hospitalizations, or changes in symptoms. Call us before going to the ED for breathing or allergy symptoms since we might be able to fit you in for a sick visit. Feel free to contact us anytime with any questions, problems, or concerns.  It was a pleasure to see you today!  Websites that have reliable patient information: 1. American Academy of Asthma, Allergy, and Immunology: www.aaaai.org 2. Food Allergy Research and Education (FARE): foodallergy.org 3. Mothers of Asthmatics: http://www.asthmacommunitynetwork.org 4. American College of Allergy, Asthma, and Immunology: www.acaai.org   COVID-19 Vaccine Information can be found at: [email or web address] For questions related to vaccine distribution or appointments, please email [email or web address] or call [phone].   We realize that you might be concerned about having an allergic reaction to the COVID19 vaccines. To help with that concern, WE ARE OFFERING THE COVID19 VACCINES IN OUR OFFICE! Ask the front desk for dates!     "Like" [place] on  Facebook and Instagram for our latest updates!      A healthy democracy works best when [employer] participate! Make sure you are registered to vote! If you have moved or changed any of your contact information, you will need to get this updated before voting!  In some cases, you MAY be able to register to vote online: [email or web address]

## 2023-04-18 HISTORY — PX: WISDOM TOOTH EXTRACTION: SHX21

## 2023-07-14 ENCOUNTER — Other Ambulatory Visit: Payer: Self-pay

## 2023-07-14 ENCOUNTER — Ambulatory Visit
Admission: RE | Admit: 2023-07-14 | Discharge: 2023-07-14 | Disposition: A | Discharge status: Home / Self Care | Source: Ambulatory Visit | Attending: Physician Assistant | Admitting: Physician Assistant

## 2023-07-14 VITALS — BP 127/86 | HR 99 | Temp 98.9°F | Resp 18 | Ht 64.0 in | Wt 167.0 lb

## 2023-07-14 DIAGNOSIS — R300 Dysuria: Secondary | ICD-10-CM | POA: Insufficient documentation

## 2023-07-14 DIAGNOSIS — Z113 Encounter for screening for infections with a predominantly sexual mode of transmission: Secondary | ICD-10-CM | POA: Diagnosis not present

## 2023-07-14 DIAGNOSIS — R102 Pelvic and perineal pain: Secondary | ICD-10-CM | POA: Diagnosis present

## 2023-07-14 DIAGNOSIS — N898 Other specified noninflammatory disorders of vagina: Secondary | ICD-10-CM | POA: Diagnosis not present

## 2023-07-14 LAB — POCT URINALYSIS DIP (MANUAL ENTRY)
Bilirubin, UA: NEGATIVE
Glucose, UA: NEGATIVE mg/dL
Ketones, POC UA: NEGATIVE mg/dL
Nitrite, UA: NEGATIVE
Spec Grav, UA: 1.025
Urobilinogen, UA: 0.2 U/dL
pH, UA: 6

## 2023-07-14 LAB — POCT URINE PREGNANCY: Preg Test, Ur: NEGATIVE

## 2023-07-14 MED ORDER — FLUCONAZOLE 150 MG PO TABS: 150.0000 mg | ORAL_TABLET | ORAL | 0 refills | Status: DC | PRN

## 2023-07-14 NOTE — Discharge Instructions (Addendum)
 You were seen today for concerns of vulvovaginal itching, pain with urination that have been ongoing for about a week.  Your urine dip was notable for some blood and white blood cells.  This is sometimes consistent with a UTI but can also be caused by contamination from what ever is causing your vulvovaginal symptoms.  I have sent a sample off for a urine culture to assess for bacterial growth.  If any medications are indicated by those results they will be sent to the pharmacy that we have on file.   We collected a cervicovaginal swab that we will assess for gonorrhea, chlamydia, trichomonas, bacterial vaginosis, yeast.  If collected blood work that we will assess for HIV and syphilis.  We will keep you updated with these results once they are available.  If any medications are indicated by those test results we will call you and medications will either be sent to the pharmacy on file or you can return to the urgent care for an injection.  It is recommended that you refrain from sexual activity until your test results are negative or until you have completed an appropriate medication regimen as dictated by your test results.  Please use a condom or another barrier method to help prevent STD transmission.  Please make sure that you communicate with your partners regarding your test results should any positive results, about as they will also need to be tested and screened.  I have sent in a medication called Diflucan which helps treat vulvovaginal yeast infections.  You can take 1 tablet every 3 days as needed for symptoms.  If you are prescribed an antibiotic going forward I recommend requesting a refill of this as antibiotics can cause vulvovaginal yeast infections.

## 2023-07-14 NOTE — ED Triage Notes (Signed)
 Pt presents to urgent care for STD panel, requesting vaginal swab and blood work. Pt does state she is having vaginal discomfort and stinging with urination. Symptoms began about six days ago, they come and go. Pt currently denies pain. Boric acid vaginal suppository inserted on Friday of last week with no noticeable improvement.

## 2023-07-14 NOTE — ED Provider Notes (Signed)
 [facility]    CSN: [id number] Arrival date & time: 07/14/23  1818      History   Chief Complaint Chief Complaint  Patient presents with   SEXUALLY TRANSMITTED DISEASE    Entered by patient   Burning with Urination     HPI Regina Robinson is a 23 y.o. female.   HPI Pt would like testing for STDs She reports she is having vulvovaginal itching along with dysuria She has been having this for about 6 days   She is sexually active with single female partner  She denies that her partner has expressed concerns for STD infection/ symptoms   Interventions: boric acid vaginal suppository about 2 days after symptoms started No change in symptoms     Past Medical History:  Diagnosis Date   Allergy  03/2021   Think i may be allergic to tomatos   Urticaria     Patient Active Problem List   Diagnosis Date Noted   Encounter for health maintenance examination in adult 08/08/2019   Screening for tuberculosis 08/08/2019   Screening for lipid disorders 08/08/2019   Screening for deficiency anemia 08/08/2019   Need for Tdap vaccination 08/08/2019   Need for meningitis vaccination 08/08/2019   Screening for condition 08/08/2019   Adverse exposure in workplace 08/08/2019   Routine general medical examination at a health care facility 03/30/2018   Need for meningococcus vaccine 03/30/2018   Tinea versicolor 01/04/2011    Past Surgical History:  Procedure Laterality Date   NO PAST SURGERIES  07/2021    OB History   No obstetric history on file.      Home Medications    Prior to Admission medications   Medication Sig Start Date End Date Taking? Authorizing Provider  fluconazole (DIFLUCAN) 150 MG tablet Take 1 tablet (150 mg total) by mouth every three (3) days as needed. May repeat in 3 days if symptoms not resolved 07/14/23  Yes Tashanna Dolin E, PA-C  LARIN FE 1/20 1-20 MG-MCG tablet Take 1 tablet by mouth daily. Junel-Iron 12/06/19   [provider]     Family History Family History  Problem Relation Age of Onset   Sickle cell trait Mother    Diabetes Maternal Grandfather    Heart disease Paternal Grandfather        heart transplant   Cancer Neg Hx    Asthma Neg Hx    Allergic rhinitis Neg Hx    Immunodeficiency Neg Hx    Eczema Neg Hx    Urticaria Neg Hx    Angioedema Neg Hx     Social History Social History   Tobacco Use   Smoking status: Never    Passive exposure: Never   Smokeless tobacco: Never  Vaping Use   Vaping status: Never Used  Substance Use Topics   Alcohol use: Yes    Comment: occasional   Drug use: No     Allergies   Sulfa drugs cross reactors   Review of Systems Review of Systems  Constitutional:  Negative for chills and fever.  Gastrointestinal:  Negative for abdominal pain, nausea and vomiting.  Genitourinary:  Positive for dysuria. Negative for difficulty urinating, flank pain, frequency, genital sores, pelvic pain, vaginal bleeding, vaginal discharge and vaginal pain.       Vulvovaginal itching   Skin:  Negative for rash.     Physical Exam Triage Vital Signs ED Triage Vitals  Encounter Vitals Group     BP 07/14/23 1907 127/86  Systolic BP Percentile --      Diastolic BP Percentile --      Pulse Rate 07/14/23 1907 99     Resp 07/14/23 1907 18     Temp 07/14/23 1907 98.9 F (37.2 C)     Temp Source 07/14/23 1907 Oral     SpO2 07/14/23 1907 100 %     Weight 07/14/23 1909 167 lb (75.8 kg)     Height 07/14/23 1909 5\' 4"  (1.626 m)     Head Circumference --      Peak Flow --      Pain Score 07/14/23 1909 0     Pain Loc --      Pain Education --      Exclude from Growth Chart --    No data found.  Updated Vital Signs BP 127/86 (BP Location: Right Arm)   Pulse 99   Temp 98.9 F (37.2 C) (Oral)   Resp 18   Ht 5\' 4"  (1.626 m)   Wt 167 lb (75.8 kg)   LMP 07/08/2023 (Exact Date)   SpO2 100%   BMI 28.67 kg/m   Visual Acuity Right Eye Distance:   Left Eye Distance:    Bilateral Distance:    Right Eye Near:   Left Eye Near:    Bilateral Near:     Physical Exam Vitals reviewed.  Constitutional:      General: She is awake.     Appearance: Normal appearance. She is well-developed and well-groomed.  HENT:     Head: Normocephalic and atraumatic.  Eyes:     General: Lids are normal. Gaze aligned appropriately.     Extraocular Movements: Extraocular movements intact.     Conjunctiva/sclera: Conjunctivae normal.  Pulmonary:     Effort: Pulmonary effort is normal.  Neurological:     General: No focal deficit present.     Mental Status: She is alert and oriented to person, place, and time.     GCS: GCS eye subscore is 4. GCS verbal subscore is 5. GCS motor subscore is 6.     Cranial Nerves: No cranial nerve deficit, dysarthria or facial asymmetry.  Psychiatric:        Attention and Perception: Attention and perception normal.        Mood and Affect: Mood and affect normal.        Speech: Speech normal.        Behavior: Behavior normal. Behavior is cooperative.        Thought Content: Thought content normal.      UC Treatments / Results  Labs (all labs ordered are listed, but only abnormal results are displayed) Labs Reviewed  POCT URINALYSIS DIP (MANUAL ENTRY) - Abnormal; Notable for the following components:      Result Value   Clarity, UA cloudy (*)    Blood, UA trace-intact (*)    Protein Ur, POC trace (*)    Leukocytes, UA Small (1+) (*)    All other components within normal limits  POCT URINE PREGNANCY - Normal  URINE CULTURE  RPR  HIV ANTIBODY (ROUTINE TESTING W REFLEX)  CERVICOVAGINAL ANCILLARY ONLY    EKG   Radiology No results found.  Procedures Procedures (including critical care time)  Medications Ordered in UC Medications - No data to display  Initial Impression / Assessment and Plan / UC Course  I have reviewed the triage vital signs and the nursing notes.  Pertinent labs & imaging results that were available  during my care  of the patient were reviewed by me and considered in my medical decision making (see chart for details).      Final Clinical Impressions(s) / UC Diagnoses   Final diagnoses:  Vulvovaginal discomfort  Vaginal discharge  Screening examination for STD (sexually transmitted disease)  Dysuria   Patient presents today with concerns for vulvovaginal itching, dysuria has been ongoing for about 6 days.  She is amenable to STD testing.  Urine pregnancy was negative.  Urine dip was notable for trace amounts of blood and small amount of leukocytes.  Unsure if this is contamination from vulvovaginal symptoms versus UTI.  Will send urine culture off for definitive rule out.  Results to dictate further management.  Will collect cervicovaginal swab to assess for BV, trichomoniasis, yeast, gonorrhea and chlamydia.  Will also get blood work for syphilis and HIV.  Results to dictate further management.  Given patient's symptoms we will send in Diflucan to assist with vulvovaginal itching as a suspect that she is likely having a yeast infection.  Recommend refraining from sexual activity until test results are negative or she has completed an appropriate medication regimen.  Follow-up as needed    Discharge Instructions      You were seen today for concerns of vulvovaginal itching, pain with urination that have been ongoing for about a week.  Your urine dip was notable for some blood and white blood cells.  This is sometimes consistent with a UTI but can also be caused by contamination from what ever is causing your vulvovaginal symptoms.  I have sent a sample off for a urine culture to assess for bacterial growth.  If any medications are indicated by those results they will be sent to the pharmacy that we have on file.   We collected a cervicovaginal swab that we will assess for gonorrhea, chlamydia, trichomonas, bacterial vaginosis, yeast.  If collected blood work that we will assess for HIV and  syphilis.  We will keep you updated with these results once they are available.  If any medications are indicated by those test results we will call you and medications will either be sent to the pharmacy on file or you can return to the urgent care for an injection.  It is recommended that you refrain from sexual activity until your test results are negative or until you have completed an appropriate medication regimen as dictated by your test results.  Please use a condom or another barrier method to help prevent STD transmission.  Please make sure that you communicate with your partners regarding your test results should any positive results, about as they will also need to be tested and screened.  I have sent in a medication called Diflucan which helps treat vulvovaginal yeast infections.  You can take 1 tablet every 3 days as needed for symptoms.  If you are prescribed an antibiotic going forward I recommend requesting a refill of this as antibiotics can cause vulvovaginal yeast infections.    ED Prescriptions     Medication Sig Dispense Auth. Provider   fluconazole (DIFLUCAN) 150 MG tablet Take 1 tablet (150 mg total) by mouth every three (3) days as needed. May repeat in 3 days if symptoms not resolved 2 tablet Jasaiah Karwowski E, PA-C      PDMP not reviewed this encounter.   Woodford Hayward 07/14/23 2033

## 2023-07-15 LAB — URINE CULTURE: Culture: NO GROWTH

## 2023-07-15 LAB — CERVICOVAGINAL ANCILLARY ONLY
Bacterial Vaginitis (gardnerella): POSITIVE — AB
Candida Glabrata: NEGATIVE
Candida Vaginitis: NEGATIVE
Chlamydia: POSITIVE — AB
Comment: NEGATIVE
Comment: NEGATIVE
Comment: NEGATIVE
Comment: NEGATIVE
Comment: NEGATIVE
Comment: NORMAL
Neisseria Gonorrhea: NEGATIVE
Trichomonas: POSITIVE — AB

## 2023-07-15 LAB — HIV ANTIBODY (ROUTINE TESTING W REFLEX): HIV Screen 4th Generation wRfx: NONREACTIVE

## 2023-07-15 LAB — RPR: RPR Ser Ql: NONREACTIVE

## 2023-07-16 ENCOUNTER — Telehealth: Payer: Self-pay | Admitting: Emergency Medicine

## 2023-07-16 ENCOUNTER — Ambulatory Visit (HOSPITAL_COMMUNITY): Payer: Self-pay

## 2023-07-16 MED ORDER — FLUCONAZOLE 150 MG PO TABS: ORAL_TABLET | ORAL | 0 refills | Status: DC

## 2023-07-16 MED ORDER — METRONIDAZOLE 500 MG PO TABS: 500.0000 mg | ORAL_TABLET | Freq: Two times a day (BID) | ORAL | 0 refills | Status: AC

## 2023-07-16 MED ORDER — DOXYCYCLINE HYCLATE 100 MG PO TABS: 100.0000 mg | ORAL_TABLET | Freq: Two times a day (BID) | ORAL | 0 refills | Status: AC

## 2023-08-27 ENCOUNTER — Ambulatory Visit: Payer: Self-pay | Admitting: Medical

## 2023-08-27 ENCOUNTER — Encounter: Payer: Self-pay | Admitting: Medical

## 2023-08-27 ENCOUNTER — Ambulatory Visit: Payer: BC Managed Care – PPO | Admitting: Medical

## 2023-08-27 VITALS — BP 110/70 | HR 83 | Ht 65.0 in | Wt 169.8 lb

## 2023-08-27 DIAGNOSIS — Z1389 Encounter for screening for other disorder: Secondary | ICD-10-CM | POA: Diagnosis not present

## 2023-08-27 DIAGNOSIS — Z0000 Encounter for general adult medical examination without abnormal findings: Secondary | ICD-10-CM

## 2023-08-27 DIAGNOSIS — Z13220 Encounter for screening for lipoid disorders: Secondary | ICD-10-CM

## 2023-08-27 DIAGNOSIS — Z136 Encounter for screening for cardiovascular disorders: Secondary | ICD-10-CM

## 2023-08-27 DIAGNOSIS — B359 Dermatophytosis, unspecified: Secondary | ICD-10-CM | POA: Diagnosis not present

## 2023-08-27 LAB — POCT URINALYSIS DIP (PROADVANTAGE DEVICE)
Bilirubin, UA: NEGATIVE
Glucose, UA: NEGATIVE mg/dL
Ketones, POC UA: NEGATIVE mg/dL
Leukocytes, UA: NEGATIVE
Nitrite, UA: NEGATIVE
Protein Ur, POC: NEGATIVE mg/dL
Specific Gravity, Urine: 1.015
Urobilinogen, Ur: NEGATIVE
pH, UA: 7 (ref 5.0–8.0)

## 2023-08-27 LAB — LIPID PANEL

## 2023-08-27 MED ORDER — TERBINAFINE HCL 1 % EX CREA: 1.0000 | TOPICAL_CREAM | Freq: Two times a day (BID) | CUTANEOUS | 0 refills | Status: AC

## 2023-08-27 NOTE — Progress Notes (Signed)
 Subjective: Chief Complaint  Patient presents with   Annual Exam    Fasting cpe, no concerns, sees obgyn   Medical team: Sees dentist, gynecology, Dr. Donna Just, [facility] Leaman Abe, Alm RAMAN, PA-C here for primary care   Concerns Working as a Engineer, civil (consulting) at [employer].  Exercises 2-3 x per week.   She and mom go to [facility] for exercise classes.  Reviewed their medical, surgical, family, social, medication, and allergy  history and updated chart as appropriate.  Past Medical History:  Diagnosis Date   Allergy  03/2021   Think i may be allergic to tomatos   Urticaria      Past Surgical History:  Procedure Laterality Date   WISDOM TOOTH EXTRACTION  04/2023    Social History   Socioeconomic History   Marital status: Single    Spouse name: Not on file   Number of children: Not on file   Years of education: Not on file   Highest education level: Not on file  Occupational History   Not on file  Tobacco Use   Smoking status: Never    Passive exposure: Never   Smokeless tobacco: Never  Vaping Use   Vaping status: Never Used  Substance and Sexual Activity   Alcohol use: Yes    Comment: occasional   Drug use: No   Sexual activity: Yes    Birth control/protection: Condom, Pill  Other Topics Concern   Not on file  Social History Narrative   Working at [employer]  Went to [employer].   Graduated [employer] program 2023.   Exercise - plays basketball, goes to the gym.  08/2023   Social Drivers of Health   Financial Resource Strain: Low Risk  (08/25/2022)   Overall Financial Resource Strain (CARDIA)    Difficulty of Paying Living Expenses: Not hard at all  Food Insecurity: No Food Insecurity (08/25/2022)   Hunger Vital Sign    Worried About Running Out of Food in the Last Year: Never true    Ran Out of Food in the Last Year: Never true  Transportation Needs: No Transportation Needs (08/25/2022)   PRAPARE - Therapist, art (Medical): No    Lack of Transportation (Non-Medical): No  Physical Activity: Insufficiently Active (08/25/2022)   Exercise Vital Sign    Days of Exercise per Week: 3 days    Minutes of Exercise per Session: 40 min  Stress: No Stress Concern Present (08/25/2022)   [employer] of Occupational Health - Occupational Stress Questionnaire    Feeling of Stress : Not at all  Social Connections: Moderately Integrated (08/25/2022)   Social Connection and Isolation Panel    Frequency of Communication with Friends and Family: More than three times a week    Frequency of Social Gatherings with Friends and Family: Once a week    Attends Religious Services: More than 4 times per year    Active Member of [employer] or Organizations: Yes    Attends Banker Meetings: 1 to 4 times per year    Marital Status: Never married  Intimate Partner Violence: Not At Risk (08/25/2022)   Humiliation, Afraid, Rape, and Kick questionnaire    Fear of Current or Ex-Partner: No    Emotionally Abused: No    Physically Abused: No    Sexually Abused: No    Family History  Problem Relation Age of Onset   Sickle cell trait Mother    Diabetes  Maternal Grandfather    Heart disease Paternal Grandfather        heart transplant   Cancer Neg Hx    Asthma Neg Hx    Allergic rhinitis Neg Hx    Immunodeficiency Neg Hx    Eczema Neg Hx    Urticaria Neg Hx    Angioedema Neg Hx      Current Outpatient Medications:    norelgestromin-ethinyl estradiol (XULANE) 150-35 MCG/24HR transdermal patch, Place 1 patch onto the skin once a week., Disp: , Rfl:    terbinafine  (LAMISIL  AT) 1 % cream, Apply 1 Application topically 2 (two) times daily., Disp: 30 g, Rfl: 0  Allergies  Allergen Reactions   Sulfa Drugs Cross Reactors Hives   Review of Systems  Constitutional:  Negative for chills, fever, malaise/fatigue and weight loss.  HENT:  Negative for congestion, ear pain, hearing loss, sore throat and  tinnitus.   Eyes:  Negative for blurred vision, pain and redness.  Respiratory:  Negative for cough, hemoptysis and shortness of breath.   Cardiovascular:  Negative for chest pain, palpitations, orthopnea, claudication and leg swelling.  Gastrointestinal:  Negative for abdominal pain, blood in stool, constipation, diarrhea, nausea and vomiting.  Genitourinary:  Negative for dysuria, flank pain, frequency, hematuria and urgency.  Musculoskeletal:  Negative for falls, joint pain and myalgias.  Skin:  Negative for itching and rash.       Skin lesion of right face  Neurological:  Negative for dizziness, tingling, speech change, weakness and headaches.  Endo/Heme/Allergies:  Negative for polydipsia. Does not bruise/bleed easily.  Psychiatric/Behavioral:  Negative for depression and memory loss. The patient is not nervous/anxious and does not have insomnia.      Objective:  BP 110/70   Pulse 83   Ht 5' 5 (1.651 m)   Wt 169 lb 12.8 oz (77 kg)   SpO2 100%   BMI 28.26 kg/m   Wt Readings from Last 3 Encounters:  08/27/23 169 lb 12.8 oz (77 kg)  07/14/23 167 lb (75.8 kg)  11/04/22 177 lb 9.6 oz (80.6 kg)   General appearance: alert, no distress, WD/WN, African American female Skin: right temple with 1cm flat hypopigmented area and some pink irritated skin on right upper eyelid, otherwise skin unremarkable.  neck: supple, no lymphadenopathy, no thyromegaly, no masses, normal ROM, no bruits Chest: non tender, normal shape and expansion Heart: RRR, normal S1, S2, no murmurs Lungs: CTA bilaterally, no wheezes, rhonchi, or rales Abdomen: +bs, soft, non tender, non distended, no masses, no hepatomegaly, no splenomegaly, no bruits Back: non tender, normal ROM, no scoliosis Musculoskeletal: upper extremities non tender, no obvious deformity, normal ROM throughout, lower extremities non tender, no obvious deformity, normal ROM throughout Extremities: no edema, no cyanosis, no clubbing Pulses: 2+  symmetric, upper and lower extremities, normal cap refill Neurological: alert, oriented x 3, CN2-12 intact, strength normal upper extremities and lower extremities, sensation normal throughout, DTRs 2+ throughout, no cerebellar signs, gait normal Psychiatric: normal affect, behavior normal, pleasant  Breast/gyn/rectal - deferred to gynecology     Assessment and Plan :   Encounter Diagnoses  Name Primary?   Encounter for health maintenance examination in adult Yes   Encounter for lipid screening for cardiovascular disease    Screening for hematuria or proteinuria    Tinea     This visit was a preventative care visit, also known as wellness visit or routine physical.   Topics typically include healthy lifestyle, diet, exercise, preventative care, vaccinations, sick and well  care, proper use of emergency dept and after hours care, as well as other concerns.     Recommendations: Continue to return yearly for your annual wellness and preventative care visits.  This gives us  a chance to discuss healthy lifestyle, exercise, vaccinations, review your chart record, and perform screenings where appropriate.  I recommend you see your dentist yearly for routine dental care including hygiene visits twice yearly.   Vaccination recommendations were reviewed Immunization History  Administered Date(s) Administered   DTaP 05/17/1998, 07/17/1998, 09/20/1998, 08/05/1999, 03/27/2003   HIB (PRP-OMP) 05/17/1998, 07/17/1998, 09/20/1998, 08/05/1999   Hepatitis B 05/17/1998, 07/17/1998, 02/07/1999   Hpv-Unspecified 05/22/2016, 07/21/2016, 11/24/2016   IPV 05/17/1998, 07/17/1998, 02/07/1999, 03/27/2003   Influenza Split 11/20/2021   Influenza,inj,Quad PF,6+ Mos 05/12/2016, 11/03/2020   MMR 05/03/1999, 03/27/2003   Meningococcal B, OMV 09/18/2016, 09/25/2017   Meningococcal Mcv4o 09/18/2016, 08/08/2019   Moderna SARS-COV2 Booster Vaccination 04/06/2020   Moderna Sars-Covid-2 Vaccination 05/05/2019,  06/07/2019   Pneumococcal Conjugate-13 07/17/1998, 09/20/1998, 02/07/1999, 05/03/1999   Tdap 09/19/2009, 08/08/2019   Varicella 05/03/1999, 09/18/2016   Advised yearly flu shot   Screening for cancer: Colon cancer screening: Age 80  Breast cancer screening: You should perform a self breast exam monthly   Baseline mammogram at age 66  Cervical cancer screening: We reviewed recommendations for pap smear screening.   Skin cancer screening: Check your skin regularly for new changes, growing lesions, or other lesions of concern Come in for evaluation if you have skin lesions of concern.  Lung cancer screening: If you have a greater than 20 pack year history of tobacco use, then you may qualify for lung cancer screening with a chest CT scan.   Please call your insurance company to inquire about coverage for this test.  We currently don't have screenings for other cancers besides breast, cervical, colon, and lung cancers.  If you have a strong family history of cancer or have other cancer screening concerns, please let me know.    Bone health: Get at least 150 minutes of aerobic exercise weekly Get weight bearing exercise at least once weekly Bone density test:  A bone density test is an imaging test that uses a type of X-ray to measure the amount of calcium and other minerals in your bones. The test may be used to diagnose or screen you for a condition that causes weak or thin bones (osteoporosis), predict your risk for a broken bone (fracture), or determine how well your osteoporosis treatment is working. The bone density test is recommended for females 65 and older, or females or males <65 if certain risk factors such as thyroid disease, long term use of steroids such as for asthma or rheumatological issues, vitamin D deficiency, estrogen deficiency, family history of osteoporosis, self or family history of fragility fracture in first degree relative.   Heart health: Get at least  150 minutes of aerobic exercise weekly Limit alcohol It is important to maintain a healthy blood pressure and healthy cholesterol numbers  Heart disease screening: Screening for heart disease includes screening for blood pressure, fasting lipids, glucose/diabetes screening, BMI height to weight ratio, reviewed of smoking status, physical activity, and diet.    Goals include blood pressure 120/80 or less, maintaining a healthy lipid/cholesterol profile, preventing diabetes or keeping diabetes numbers under good control, not smoking or using tobacco products, exercising most days per week or at least 150 minutes per week of exercise, and eating healthy variety of fruits and vegetables, healthy oils, and avoiding unhealthy food choices  like fried food, fast food, high sugar and high cholesterol foods.     Medical care options: I recommend you continue to seek care here first for routine care.  We try really hard to have available appointments Monday through Friday daytime hours for sick visits, acute visits, and physicals.  Urgent care should be used for after hours and weekends for significant issues that cannot wait till the next day.  The emergency department should be used for significant potentially life-threatening emergencies.  The emergency department is expensive, can often have long wait times for less significant concerns, so try to utilize primary care, urgent care, or telemedicine when possible to avoid unnecessary trips to the emergency department.  Virtual visits and telemedicine have been introduced since the pandemic started in 2020, and can be convenient ways to receive medical care.  We offer virtual appointments as well to assist you in a variety of options to seek medical care.   Other issues: Skin lesions - likely tinea face, advised Lamisil  topically for 2-3 weeks.  Consider selsun blue once weekly in scalp and face.  If not resolving in 2-3 weeks, then recheck.  On contraception  per gynecology    Regina Robinson was seen today for annual exam.  Diagnoses and all orders for this visit:  Encounter for health maintenance examination in adult -     Comprehensive metabolic panel with GFR -     CBC -     Lipid panel -     POCT Urinalysis DIP (Proadvantage Device)  Encounter for lipid screening for cardiovascular disease -     Lipid panel  Screening for hematuria or proteinuria -     POCT Urinalysis DIP (Proadvantage Device)  Tinea  Other orders -     terbinafine  (LAMISIL  AT) 1 % cream; Apply 1 Application topically 2 (two) times daily.    Follow-up pending labs, yearly for physical

## 2023-08-27 NOTE — Progress Notes (Signed)
 Was she on LMP?

## 2023-08-28 LAB — CBC
Hematocrit: 36.3 % (ref 34.0–46.6)
Hemoglobin: 11.8 g/dL (ref 11.1–15.9)
MCH: 27.8 pg (ref 26.6–33.0)
MCHC: 32.5 g/dL (ref 31.5–35.7)
MCV: 85 fL (ref 79–97)
Platelets: 288 x10E3/uL (ref 150–450)
RBC: 4.25 x10E6/uL (ref 3.77–5.28)
RDW: 14.1 % (ref 11.7–15.4)
WBC: 4.4 x10E3/uL (ref 3.4–10.8)

## 2023-08-28 LAB — COMPREHENSIVE METABOLIC PANEL WITH GFR
ALT: 20 IU/L (ref 0–32)
AST: 22 IU/L (ref 0–40)
Albumin: 4.5 g/dL (ref 4.0–5.0)
Alkaline Phosphatase: 72 IU/L (ref 44–121)
BUN/Creatinine Ratio: 16 (ref 9–23)
BUN: 15 mg/dL (ref 6–20)
Bilirubin Total: 0.3 mg/dL (ref 0.0–1.2)
CO2: 20 mmol/L (ref 20–29)
Calcium: 9 mg/dL (ref 8.7–10.2)
Chloride: 104 mmol/L (ref 96–106)
Creatinine, Ser: 0.91 mg/dL (ref 0.57–1.00)
Globulin, Total: 2.2 g/dL (ref 1.5–4.5)
Glucose: 84 mg/dL (ref 70–99)
Potassium: 4.4 mmol/L (ref 3.5–5.2)
Sodium: 139 mmol/L (ref 134–144)
Total Protein: 6.7 g/dL (ref 6.0–8.5)
eGFR: 90 mL/min/1.73 (ref >59)

## 2023-08-28 LAB — LIPID PANEL
Chol/HDL Ratio: 3 ratio (ref 0.0–4.4)
Cholesterol, Total: 182 mg/dL (ref 100–199)
HDL: 60 mg/dL (ref >39)
LDL Chol Calc (NIH): 111 mg/dL — ABNORMAL HIGH (ref 0–99)
Triglycerides: 56 mg/dL (ref 0–149)
VLDL Cholesterol Cal: 11 mg/dL (ref 5–40)

## 2023-08-28 NOTE — Progress Notes (Signed)
 Results sent through MyChart

## 2023-09-30 ENCOUNTER — Encounter: Admitting: Medical

## 2023-11-04 ENCOUNTER — Ambulatory Visit: Admitting: Medical

## 2023-11-09 ENCOUNTER — Ambulatory Visit (INDEPENDENT_AMBULATORY_CARE_PROVIDER_SITE_OTHER): Admitting: Medical

## 2023-11-09 VITALS — BP 110/70 | HR 89 | Temp 98.0°F | Wt 168.8 lb

## 2023-11-09 DIAGNOSIS — N898 Other specified noninflammatory disorders of vagina: Secondary | ICD-10-CM

## 2023-11-09 DIAGNOSIS — Z113 Encounter for screening for infections with a predominantly sexual mode of transmission: Secondary | ICD-10-CM | POA: Diagnosis not present

## 2023-11-09 NOTE — Progress Notes (Signed)
 Subjective: Chief Complaint  Patient presents with   Acute Visit    Would like STI testing. Having some itchy, irritation x 1 week. Will get flu shot through job   Regina Robinson is a 23 year old female who presents with vaginal itching and discomfort.  She experiences vaginal itchiness and discomfort without any abnormal discharge, odor, or change in color. No urinary symptoms, fever, or abdominal pain. Her last STD screening in June was negative for all tested infections. However, in May, she tested positive for chlamydia and trichomoniasis, treated with metronidazole  and doxycycline . Symptoms were mild at that time, primarily itchiness.  She has a new partner.  She informed her prior partner about the positive results but is unsure if they received treatment.  She has had yeast infections in the past but not recently, and finds it difficult to compare current symptoms to those past experiences.  No other aggravating or relieving factors. No other complaint.   Past Medical History:  Diagnosis Date   Allergy  03/2021   Think i may be allergic to tomatos   Urticaria    Current Outpatient Medications on File Prior to Visit  Medication Sig Dispense Refill   norelgestromin-ethinyl estradiol (XULANE) 150-35 MCG/24HR transdermal patch Place 1 patch onto the skin once a week.     terbinafine  (LAMISIL  AT) 1 % cream Apply 1 Application topically 2 (two) times daily. 30 g 0   No current facility-administered medications on file prior to visit.    ROS as in subjective   Objective: BP 110/70   Pulse 89   Temp 98 F (36.7 C)   Wt 168 lb 12.8 oz (76.6 kg)   BMI 28.09 kg/m   Gen: wd, wn, nad Gyn: Normal external genitalia without lesions, vagina with normal mucosa, cervix without lesions but +erythema, no cervical motion tenderness, whitish medium thick abnormal vaginal discharge.  Swabs taken.  Exam chaperoned by nurse.     Assessment: Encounter Diagnoses  Name Primary?    Vaginal itching Yes   Screen for STD (sexually transmitted disease)      Plan: Vulvovaginal pruritus and discomfort -discussed routine testing, screening Discussed herpes antibody testing limitations. Emphasized condom use and partner testing. - Order STD screening including HIV, syphilis, gonorrhea, chlamydia, and trichomonas via swabs and blood tests. - Consider mycoplasma testing if symptoms recur.   [name] was seen today for acute visit.  Diagnoses and all orders for this visit:  Vaginal itching -     NuSwab Vaginitis Plus (VG+)  Screen for STD (sexually transmitted disease) -     HIV Antibody (routine testing w rflx) -     RPR -     HSV 1 antibody, IgG -     HSV-2 Ab, IgG -     NuSwab Vaginitis Plus (VG+)    F/u pending labs

## 2023-11-10 ENCOUNTER — Ambulatory Visit: Payer: Self-pay | Admitting: Medical

## 2023-11-10 LAB — RPR: RPR Ser Ql: NONREACTIVE

## 2023-11-10 LAB — HIV ANTIBODY (ROUTINE TESTING W REFLEX): HIV Screen 4th Generation wRfx: NONREACTIVE

## 2023-11-10 LAB — HSV-2 AB, IGG: HSV 2 IgG, Type Spec: NONREACTIVE

## 2023-11-10 LAB — HSV 1 ANTIBODY, IGG: HSV 1 Glycoprotein G Ab, IgG: NONREACTIVE

## 2023-11-10 NOTE — Progress Notes (Signed)
 Results through MyChart

## 2023-11-11 ENCOUNTER — Other Ambulatory Visit: Payer: Self-pay | Admitting: Medical

## 2023-11-11 LAB — NUSWAB VAGINITIS PLUS (VG+)
Candida albicans, NAA: NEGATIVE
Candida glabrata, NAA: NEGATIVE

## 2023-11-11 MED ORDER — FLUCONAZOLE 150 MG PO TABS: 150.0000 mg | ORAL_TABLET | ORAL | 0 refills | Status: DC

## 2023-11-11 MED ORDER — METRONIDAZOLE 500 MG PO TABS: 500.0000 mg | ORAL_TABLET | Freq: Two times a day (BID) | ORAL | 0 refills | Status: DC

## 2023-11-11 NOTE — Progress Notes (Signed)
 Results to MyChart

## 2023-12-09 ENCOUNTER — Ambulatory Visit: Admitting: Medical

## 2023-12-09 VITALS — BP 120/82 | HR 111 | Temp 98.6°F | Wt 165.2 lb

## 2023-12-09 DIAGNOSIS — Z113 Encounter for screening for infections with a predominantly sexual mode of transmission: Secondary | ICD-10-CM

## 2023-12-09 DIAGNOSIS — R109 Unspecified abdominal pain: Secondary | ICD-10-CM

## 2023-12-09 DIAGNOSIS — N898 Other specified noninflammatory disorders of vagina: Secondary | ICD-10-CM

## 2023-12-09 DIAGNOSIS — B3731 Acute candidiasis of vulva and vagina: Secondary | ICD-10-CM

## 2023-12-09 LAB — POCT WET PREP (WET MOUNT)
Clue Cells Wet Prep Whiff POC: NEGATIVE
Trichomonas Wet Prep HPF POC: ABSENT
WBC, Wet Prep HPF POC: NEGATIVE

## 2023-12-09 LAB — POCT URINE PREGNANCY: Preg Test, Ur: NEGATIVE

## 2023-12-09 MED ORDER — FLUCONAZOLE 100 MG PO TABS: 100.0000 mg | ORAL_TABLET | Freq: Every day | ORAL | 0 refills | Status: AC

## 2023-12-09 NOTE — Progress Notes (Signed)
 Name: Regina Robinson   Date of Visit: 12/09/23   Date of last visit with me: 11/09/2023   CHIEF COMPLAINT:  Chief Complaint  Patient presents with   Acute Visit    Abdominal pain x a couple weeks, comes and goes       HPI:  Discussed the use of AI scribe software for clinical note transcription with the patient, who gave verbal consent to proceed.  History of Present Illness   History of Present Illness Regina Robinson is a 23 year old female who presents with abdominal pain and vaginal discharge.  She has been experiencing intermittent cramping abdominal pain located in the center of her stomach since her last visit. The pain occurs on some days but not others. No urinary symptoms such as dysuria, increased frequency, odor, or hematuria. Bowel habits remain unchanged, with bowel movements every other day, which she considers normal.  She reports a watery, white vaginal discharge. Last visit 11/09/23 we prescribed medication.  She initially used treatment with Diflucan  which provided temporary relief, but symptoms recurred. Metronidazole  was then used, which seemed to resolve the symptoms. No external rash associated with the discharge. No recent changes in soaps or hygiene products. No new sexual partners since her last visit.  She noticed white patches on her tongue, which she thought could be oral thrush, and these resolved after she increased her oral hygiene routine to brushing twice a day. No fever, body aches, or chills.   Her last menstrual period was on October 4th, and her periods have been regular, though the last one was slightly heavier, which she attributes to a new birth control method. She notes a slight decrease in appetite recently.  No other aggravating or relieving factors. No other complaint.  Past Medical History:  Diagnosis Date   Allergy  03/2021   Think i may be allergic to tomatos   Urticaria    Current Outpatient Medications on File Prior to Visit   Medication Sig Dispense Refill   norelgestromin-ethinyl estradiol (XULANE) 150-35 MCG/24HR transdermal patch Place 1 patch onto the skin once a week.     terbinafine  (LAMISIL  AT) 1 % cream Apply 1 Application topically 2 (two) times daily. 30 g 0   No current facility-administered medications on file prior to visit.   ROS as in subjective   Objective: BP 120/82   Pulse (!) 111   Temp 98.6 F (37 C)   Wt 165 lb 3.2 oz (74.9 kg)   BMI 27.49 kg/m   Gen: wd, wn, nad Oral: MMM, no lesions, no rash, on erythema Abdomen: +bs, soft, nontender, no mass, no organomegaly Back: nontender GU deferred   Assessment and Plan Encounter Diagnoses  Name Primary?   Vaginal discharge Yes   Yeast vaginitis    Abdominal discomfort    Screen for STD (sexually transmitted disease)      Abdominal pain and vaginal discharge Intermittent abdominal cramping and watery, white vaginal discharge. Differential includes bacterial vaginosis or yeast infection. Previous STI panel negative. Discussed dietary factors and monitoring for localized pain. - Perform self vaginal swab for yeast and bacterial vaginosis. - Examine swab microscopically.  Moderate yeast.  No BV.  No trichomonas. - Monitor abdominal pain for changes. - Advise on dietary habits to avoid triggers. -Begin 1 week of Diflucan  oral  Updated labs for STD screening given concerns and last visit about a month ago..  She had had unprotected sex about a week before her last visit and we  did testing which ultimately was negative for HIV, syphilis, gonorrhea chlamydia.  She inadvertently had a needlestick injury at work this same day she was as here last visit had additional screening elsewhere.  Her oral symptoms have resolved.  She is now brushing twice a day, flossing regularly and using a tongue scraper.  No obvious signs of thrush today.    [name] was seen today for acute visit.  Diagnoses and all orders for this visit:  Vaginal  discharge -     Chlamydia/Gonococcus/Trichomonas, NAA -     POCT Wet Prep [facility]) -     POCT urine pregnancy  Yeast vaginitis -     POCT Wet Prep [employer]) -     POCT urine pregnancy  Abdominal discomfort -     POCT urine pregnancy  Screen for STD (sexually transmitted disease) -     HIV Antibody (routine testing w rflx) -     Chlamydia/Gonococcus/Trichomonas, NAA  Other orders -     fluconazole  (DIFLUCAN ) 100 MG tablet; Take 1 tablet (100 mg total) by mouth daily.    F/u prn

## 2023-12-10 ENCOUNTER — Ambulatory Visit: Admitting: Medical

## 2023-12-10 ENCOUNTER — Ambulatory Visit: Payer: Self-pay | Admitting: Internal Medicine

## 2023-12-10 LAB — CHLAMYDIA/GONOCOCCUS/TRICHOMONAS, NAA

## 2023-12-10 NOTE — Telephone Encounter (Signed)
 Waiting for an answer from [facility] before I call patient  Copied from CRM [id number]. Topic: Clinical - Lab/Test Results >> Dec 10, 2023  4:21 PM Hadassah PARAS wrote: Reason for CRM: Pt called in asking why her lab result were cancelled. She is showing this on MyChart. Please advise [phone]

## 2023-12-11 ENCOUNTER — Other Ambulatory Visit: Payer: Self-pay | Admitting: Internal Medicine

## 2023-12-11 ENCOUNTER — Encounter: Payer: Self-pay | Admitting: Internal Medicine

## 2023-12-11 DIAGNOSIS — R109 Unspecified abdominal pain: Secondary | ICD-10-CM

## 2023-12-11 DIAGNOSIS — B3731 Acute candidiasis of vulva and vagina: Secondary | ICD-10-CM

## 2023-12-11 DIAGNOSIS — Z113 Encounter for screening for infections with a predominantly sexual mode of transmission: Secondary | ICD-10-CM

## 2023-12-11 DIAGNOSIS — N898 Other specified noninflammatory disorders of vagina: Secondary | ICD-10-CM

## 2023-12-11 LAB — HIV ANTIBODY (ROUTINE TESTING W REFLEX): HIV Screen 4th Generation wRfx: NONREACTIVE

## 2023-12-11 NOTE — Progress Notes (Signed)
 Results thru my chart

## 2024-09-05 ENCOUNTER — Encounter: Payer: Self-pay | Admitting: Medical
